# Patient Record
Sex: Female | Born: 1995
Health system: Southern US, Community
[De-identification: ages and names within clinical notes are randomized; demographics above are authoritative.]

## PROBLEM LIST (undated history)

## (undated) ENCOUNTER — Inpatient Hospital Stay (HOSPITAL_COMMUNITY): Payer: Self-pay

## (undated) DIAGNOSIS — I10 Essential (primary) hypertension: Secondary | ICD-10-CM

## (undated) DIAGNOSIS — O139 Gestational [pregnancy-induced] hypertension without significant proteinuria, unspecified trimester: Secondary | ICD-10-CM

## (undated) DIAGNOSIS — J302 Other seasonal allergic rhinitis: Secondary | ICD-10-CM

## (undated) DIAGNOSIS — N921 Excessive and frequent menstruation with irregular cycle: Secondary | ICD-10-CM

## (undated) HISTORY — PX: NO PAST SURGERIES: SHX2092

## (undated) HISTORY — PX: IUD REMOVAL: SHX5392

---
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
## 2015-01-18 ENCOUNTER — Ambulatory Visit (INDEPENDENT_AMBULATORY_CARE_PROVIDER_SITE_OTHER): Payer: Medicaid Other | Admitting: Women's Health

## 2015-01-18 ENCOUNTER — Encounter: Payer: Self-pay | Admitting: Women's Health

## 2015-01-18 DIAGNOSIS — Z3041 Encounter for surveillance of contraceptive pills: Secondary | ICD-10-CM

## 2015-01-18 NOTE — Progress Notes (Signed)
Patient ID: Jennifer LundAmber Aguilar Legore, female   DOB: 1995/11/10, 20 y.o.   MRN: [id number]   [facility]  Patient name: Jennifer Aguilar Auletta MRN [id number]  Date of birth: 1995/11/10  CC & HPI:  Jennifer Aguilar Swarthout is a 20 y.o. 502P2002 Caucasian female presenting today for f/u on Sprintec- doing well, bleeding was kind of irregular in beginning but is straightening up now. No complaints. Remembers to take pills daily.   No LMP recorded. Patient has had an implant. The current method of family planning is OCP (estrogen/progesterone). Last pap n/a <21yo  Pertinent History Reviewed:  Medical & Surgical Hx:   Past Medical History  Diagnosis Date  . Seasonal allergies    Past Surgical History  Procedure Laterality Date  . No past surgeries     Medications: Reviewed & Updated - see associated section Social History: Reviewed -  reports that she has never smoked. She has never used smokeless tobacco.  Objective Findings:  Vitals: There were no vitals taken for this visit. There is no weight on file to calculate BMI.  Physical Examination: General appearance - alert, well appearing, and in no distress  No results found for this or any previous visit (from the past 24 hour(s)).   Assessment & Plan:  A:   COC f/u  P:  Continue sprintec daily  Return for 9mths for f/u./refill  Marge DuncansBooker, Jahmarion Popoff Randall CNM, [facility] 01/18/2015 2:04 PM

## 2015-01-25 ENCOUNTER — Telehealth: Payer: Self-pay | Admitting: Family Medicine

## 2015-01-25 MED ORDER — ONDANSETRON 8 MG PO TBDP: 8.0000 mg | ORAL_TABLET | Freq: Three times a day (TID) | ORAL | Status: DC | PRN

## 2015-01-25 NOTE — Telephone Encounter (Signed)
This sounds like a viral process. Clear liquids, sips, avoid regular food for female. Once tolerating liquids then can start with bland diet. Zofran 8 mg ODT one 3 times a day when necessary nausea, OTC Imodium for diarrhea, if not dramatically better within 48 hours recheck

## 2015-01-25 NOTE — Telephone Encounter (Signed)
Called patient and informed her per Dr.Scott Luking-This sounds like a viral process. Clear liquids, sips, avoid regular food for female. Once tolerating liquids then can start with bland diet. Zofran 8 mg ODT one 3 times a day when necessary nausea, OTC Imodium for diarrhea, if not dramatically better within 48 hours recheck. Patient verbalized understanding. Zofran sent into requested pharmacy.

## 2015-01-25 NOTE — Telephone Encounter (Signed)
Called and discussed patient symptoms. Patient stated that onset of symptoms this morning. C/o vomiting, and diarrhea, hot flashes (unsure if fever due to not having a thermometer). Would like something called in. Please advise?

## 2015-01-25 NOTE — Telephone Encounter (Signed)
Patient woke up this morning with diarreah and throwing up can you call in something or does she need to be seen. [facility]

## 2015-03-27 ENCOUNTER — Encounter: Payer: Self-pay | Admitting: Women's Health

## 2015-03-27 ENCOUNTER — Ambulatory Visit (INDEPENDENT_AMBULATORY_CARE_PROVIDER_SITE_OTHER): Payer: Medicaid Other | Admitting: Women's Health

## 2015-03-27 VITALS — BP 120/64 | HR 76 | Wt 153.0 lb

## 2015-03-27 DIAGNOSIS — Z3202 Encounter for pregnancy test, result negative: Secondary | ICD-10-CM | POA: Diagnosis not present

## 2015-03-27 DIAGNOSIS — N921 Excessive and frequent menstruation with irregular cycle: Secondary | ICD-10-CM | POA: Diagnosis not present

## 2015-03-27 LAB — POCT WET PREP (WET MOUNT): CLUE CELLS WET PREP WHIFF POC: NEGATIVE

## 2015-03-27 LAB — POCT URINE PREGNANCY: Preg Test, Ur: NEGATIVE

## 2015-03-27 NOTE — Progress Notes (Signed)
Patient ID: Jennifer LundAmber D Aguilar, female   DOB: 1995-02-14, 20 y.o.   MRN: [id number]   [facility]  Patient name: Jennifer Aguilar MRN [id number]  Date of birth: 1995-02-14  CC & HPI:  Jennifer Aguilar is a 20 y.o. [id number] Caucasian female presenting today for report of irregular and occ heavy periods on sprintec. Taking daily, reports not missing any or taking late. Bled whole month of Jan, then 2 weeks in Feb, then stopped for a week or so and began back. Last bleeding just stopped ~3-4d ago. +cramping. Occ small clots. At heaviest may change pad q 1hr. Denies abnormal/malodorous d/c or itching/irritation. No change in sex partners.   Patient's last menstrual period was 03/13/2015. The current method of family planning is OCP (estrogen/progesterone). Last pap <21yo  Pertinent History Reviewed:  Medical & Surgical Hx:   Past medical, surgical, family, and social history reviewed in electronic medical record Medications: Reviewed & Updated - see associated section Allergies: Reviewed in electronic medical record  Objective Findings:  Vitals: BP 120/64 mmHg  Pulse 76  Wt 153 lb (69.4 kg)  LMP 03/13/2015  Breastfeeding? No Body mass index is 25.46 kg/(m^2).  Physical Examination: General appearance - alert, well appearing, and in no distress Pelvic - normal external genitalia, vulva, vagina, cervix, uterus and adnexa, no bleeding currently  Results for orders placed or performed in visit on 03/27/15 (from the past 24 hour(s))  POCT Wet Prep Mellody Drown([place]et [place])   Collection Time: 03/27/15  3:12 PM  Result Value Ref Range   Source Wet Prep POC vaginal    WBC, Wet Prep HPF POC few    Bacteria Wet Prep HPF POC None None, Few, Too numerous to count   BACTERIA WET PREP MORPHOLOGY POC     Clue Cells Wet Prep HPF POC None None, Too numerous to count   Clue Cells Wet Prep Whiff POC Negative Whiff    Yeast Wet Prep HPF POC None    KOH Wet Prep POC     Trichomonas Wet Prep HPF POC none      Urine preg: NEG Assessment & Plan:  A:   BTB on COCs  P:  GC/CT today, If neg will consider pelvic u/s as she has been having irregular bleeding since beginning coc's- had switched from LoLoestrin to sprintec b/c of same. May also do megace algorithm.   Return for I will call pt.  Marge DuncansBooker, Mariel Lukins Randall CNM, [facility] 03/27/2015 3:12 PM

## 2015-03-28 LAB — GC/CHLAMYDIA PROBE AMP
Chlamydia trachomatis, NAA: NEGATIVE
Neisseria gonorrhoeae by PCR: NEGATIVE

## 2015-04-11 ENCOUNTER — Telehealth: Payer: Self-pay | Admitting: Women's Health

## 2015-04-11 NOTE — Telephone Encounter (Signed)
Pt scheduled for pelvic ultrasound and to see Joellyn HaffKim Booker, CNM on 04/17/2015. Pt states she should not be on her period at that time.

## 2015-04-11 NOTE — Telephone Encounter (Signed)
Pt informed of negative GC/CHL. Pt states what is the plain from here?

## 2015-04-12 ENCOUNTER — Other Ambulatory Visit: Payer: Self-pay | Admitting: Women's Health

## 2015-04-12 DIAGNOSIS — N921 Excessive and frequent menstruation with irregular cycle: Secondary | ICD-10-CM

## 2015-04-17 ENCOUNTER — Encounter: Payer: Self-pay | Admitting: Women's Health

## 2015-04-17 ENCOUNTER — Encounter: Payer: Self-pay | Admitting: *Deleted

## 2015-04-17 ENCOUNTER — Ambulatory Visit (INDEPENDENT_AMBULATORY_CARE_PROVIDER_SITE_OTHER): Payer: Medicaid Other | Admitting: Women's Health

## 2015-04-17 ENCOUNTER — Other Ambulatory Visit: Payer: Self-pay | Admitting: Women's Health

## 2015-04-17 ENCOUNTER — Ambulatory Visit (INDEPENDENT_AMBULATORY_CARE_PROVIDER_SITE_OTHER): Payer: Medicaid Other | Admitting: *Deleted

## 2015-04-17 ENCOUNTER — Ambulatory Visit (INDEPENDENT_AMBULATORY_CARE_PROVIDER_SITE_OTHER): Payer: Medicaid Other

## 2015-04-17 VITALS — BP 130/70 | HR 80 | Wt 152.0 lb

## 2015-04-17 DIAGNOSIS — Z3042 Encounter for surveillance of injectable contraceptive: Secondary | ICD-10-CM

## 2015-04-17 DIAGNOSIS — Z3202 Encounter for pregnancy test, result negative: Secondary | ICD-10-CM | POA: Diagnosis not present

## 2015-04-17 DIAGNOSIS — N921 Excessive and frequent menstruation with irregular cycle: Secondary | ICD-10-CM

## 2015-04-17 LAB — POCT URINE PREGNANCY: PREG TEST UR: NEGATIVE

## 2015-04-17 MED ORDER — MEDROXYPROGESTERONE ACETATE 150 MG/ML IM SUSP
150.0000 mg | Freq: Once | INTRAMUSCULAR | Status: AC
  Administered 2015-04-17: 150 mg via INTRAMUSCULAR

## 2015-04-17 MED ORDER — MEDROXYPROGESTERONE ACETATE 150 MG/ML IM SUSP: 150.0000 mg | INTRAMUSCULAR | Status: DC

## 2015-04-17 NOTE — Progress Notes (Signed)
Pelvic/TV US today for AUB. Anteverted uterus measuring 7.3 x 3.0 x 5.0 cm. Endometrium is thin and symmetrical, measuring 1.4 mm with trace fluid within.  Bilateral ovaries appear normal and mobile. Trace free fluid in posterior CDS.

## 2015-04-17 NOTE — Progress Notes (Signed)
Patient ID: Jennifer LundAmber D Aguilar, female   DOB: 04-05-1995, 20 y.o.   MRN: [id number]   [facility]  Patient name: Jennifer Aguilar MRN [id number]  Date of birth: 04-05-1995  CC & HPI:  Jennifer Aguilar is a 20 y.o. [id number] Caucasian female presenting today for f/u after pelvic u/s for irregular and heavy bleeding on sprintec. Had same problem w/ nexplanon, that's why she wanted it removed. Was started on Lo Loestrin then increased to sprintec with no improvement in bleeding. Not missing pills or taking any late. Will bleed for a week, stop for a week, start back and may bleed for an entire month. Reports at heaviest times changes pad 3-4 times in an hour. +cramping. Occ small clots. Has had TSH, GC/CT, wet prep, Hgb all of which were normal.  Periods prior to pregnancy were regular and of normal flow.  Patient's last menstrual period was 04/12/2015. The current method of family planning is sprintec. Last pap <21yo  Pertinent History Reviewed:  Medical & Surgical Hx:   Past medical, surgical, family, and social history reviewed in electronic medical record Medications: Reviewed & Updated - see associated section Allergies: Reviewed in electronic medical record  Objective Findings:  Vitals: BP 130/70 mmHg  Pulse 80  Wt 152 lb (68.947 kg)  LMP 04/12/2015  Breastfeeding? No Body mass index is 25.29 kg/(m^2).  Physical Examination: General appearance - alert, well appearing, and in no distress  Today's pelvic u/s:  Jennifer Aguilar is a 20 y.o. [id number] LMP 04-12-15 for a pelvic sonogram for AUB, menorrhagia with irregular cycle.  Uterus 7.3 x 3.0 x 5.0 cm, anteverted.  Endometrium 1.4 mm, symmetrical, trace fluid within  Right ovary 3.6 x 2.3 x 2.7 cm, mobile  Left ovary 2.5 x 2.4 x 2.3 cm, mobile.  Trace free fluid in posterior CDS.  Technologist Comments:  Pelvic/TV US today for AUB. Anteverted uterus measuring 7.3 x 3.0 x  5.0 cm. Endometrium is thin and symmetrical, measuring 1.4 mm with trace fluid within. Bilateral ovaries appear normal and mobile. Trace free fluid in posterior CDS.  Iva LentoStalter, Carrie Manuel 04/17/2015 1:53 PM       Assessment & Plan:  A:   Heavy irregular bleeding on coc's  Normal pelvic u/s  P:  Discussed option of switching to different coc vs depo or IUD- prefers trying depo  Rx depo w/ 3RF  To give until after 2nd shot to notice difference, if still having problems let us know  Condoms x 2wks  Return for this pm for depo.  Marge DuncansBooker, Revonda Menter Randall CNM, [facility] 04/17/2015 2:09 PM

## 2015-04-17 NOTE — Progress Notes (Signed)
Pt here for Depo. Pt tolerated shot well. Return in 12 weeks for next shot. JSY 

## 2015-04-17 NOTE — Patient Instructions (Signed)
Use condoms for 2 weeks Can take until after 2nd shot (in 3 months) to start helping bleeding, let us know if you are still having problems after that  Medroxyprogesterone injection [Contraceptive] What is this medicine? MEDROXYPROGESTERONE (me DROX ee proe JES te rone) contraceptive injections prevent pregnancy. They provide effective birth control for 3 months. Depo-subQ Provera 104 is also used for treating pain related to endometriosis. This medicine may be used for other purposes; ask your health care provider or pharmacist if you have questions. What should I tell my health care provider before I take this medicine? They need to know if you have any of these conditions: -frequently drink alcohol -asthma -blood vessel disease or a history of a blood clot in the lungs or legs -bone disease such as osteoporosis -breast cancer -diabetes -eating disorder (anorexia nervosa or bulimia) -high blood pressure -HIV infection or AIDS -kidney disease -liver disease -mental depression -migraine -seizures (convulsions) -stroke -tobacco smoker -vaginal bleeding -an unusual or allergic reaction to medroxyprogesterone, other hormones, medicines, foods, dyes, or preservatives -pregnant or trying to get pregnant -breast-feeding How should I use this medicine? Depo-Provera Contraceptive injection is given into a muscle. Depo-subQ Provera 104 injection is given under the skin. These injections are given by a health care professional. You must not be pregnant before getting an injection. The injection is usually given during the first 5 days after the start of a menstrual period or 6 weeks after delivery of a baby. Talk to your pediatrician regarding the use of this medicine in children. Special care may be needed. These injections have been used in female children who have started having menstrual periods. Overdosage: If you think you have taken too much of this medicine contact a poison control  center or emergency room at once. NOTE: This medicine is only for you. Do not share this medicine with others. What if I miss a dose? Try not to miss a dose. You must get an injection once every 3 months to maintain birth control. If you cannot keep an appointment, call and reschedule it. If you wait longer than 13 weeks between Depo-Provera contraceptive injections or longer than 14 weeks between Depo-subQ Provera 104 injections, you could get pregnant. Use another method for birth control if you miss your appointment. You may also need a pregnancy test before receiving another injection. What may interact with this medicine? Do not take this medicine with any of the following medications: -bosentan This medicine may also interact with the following medications: -aminoglutethimide -antibiotics or medicines for infections, especially rifampin, rifabutin, rifapentine, and griseofulvin -aprepitant -barbiturate medicines such as phenobarbital or primidone -bexarotene -carbamazepine -medicines for seizures like ethotoin, felbamate, oxcarbazepine, phenytoin, topiramate -modafinil -St. John's wort This list may not describe all possible interactions. Give your health care provider a list of all the medicines, herbs, non-prescription drugs, or dietary supplements you use. Also tell them if you smoke, drink alcohol, or use illegal drugs. Some items may interact with your medicine. What should I watch for while using this medicine? This drug does not protect you against HIV infection (AIDS) or other sexually transmitted diseases. Use of this product may cause you to lose calcium from your bones. Loss of calcium may cause weak bones (osteoporosis). Only use this product for more than 2 years if other forms of birth control are not right for you. The longer you use this product for birth control the more likely you will be at risk for weak bones. Ask your health care  professional how you can keep strong  bones. You may have a change in bleeding pattern or irregular periods. Many females stop having periods while taking this drug. If you have received your injections on time, your chance of being pregnant is very low. If you think you may be pregnant, see your health care professional as soon as possible. Tell your health care professional if you want to get pregnant within the next year. The effect of this medicine may last a long time after you get your last injection. What side effects may I notice from receiving this medicine? Side effects that you should report to your doctor or health care professional as soon as possible: -allergic reactions like skin rash, itching or hives, swelling of the face, lips, or tongue -breast tenderness or discharge -breathing problems -changes in vision -depression -feeling faint or lightheaded, falls -fever -pain in the abdomen, chest, groin, or leg -problems with balance, talking, walking -unusually weak or tired -yellowing of the eyes or skin Side effects that usually do not require medical attention (report to your doctor or health care professional if they continue or are bothersome): -acne -fluid retention and swelling -headache -irregular periods, spotting, or absent periods -temporary pain, itching, or skin reaction at site where injected -weight gain This list may not describe all possible side effects. Call your doctor for medical advice about side effects. You may report side effects to FDA at 1-800-FDA-1088. Where should I keep my medicine? This does not apply. The injection will be given to you by a health care professional. NOTE: This sheet is a summary. It may not cover all possible information. If you have questions about this medicine, talk to your doctor, pharmacist, or health care provider.    2016, Elsevier/Gold Standard. (2008-01-22 18:37:56)

## 2015-07-10 ENCOUNTER — Ambulatory Visit: Payer: Medicaid Other

## 2016-01-15 NOTE — L&D Delivery Note (Addendum)
Delivery Note Progressed quickly to complete dilation with urge to push.    At 8:21 AM a viable and healthy female was delivered via  (Presentation: OA  ).  APGAR: 9,9, weight  .   Placenta status: spontaneous and grossly intact with 3 vessel Cord:  with the following complications: Loose nuchal x 1  Anesthesia:  none Episiotomy:  none Lacerations:  Small labial,not bleeding not repaired Suture Repair: none Est. Blood Loss (mL):  100  Mom to postpartum.  Baby to Couplet care / Skin to Skin.  Wynelle Bourgeois 09/07/2016, 8:34 AM

## 2016-04-08 ENCOUNTER — Ambulatory Visit: Payer: Medicaid Other | Admitting: Adult Health

## 2016-04-18 ENCOUNTER — Ambulatory Visit: Payer: Self-pay | Admitting: Adult Health

## 2016-04-24 ENCOUNTER — Encounter: Payer: Self-pay | Admitting: Adult Health

## 2016-04-24 ENCOUNTER — Ambulatory Visit (INDEPENDENT_AMBULATORY_CARE_PROVIDER_SITE_OTHER): Payer: Medicaid Other | Admitting: Adult Health

## 2016-04-24 VITALS — BP 118/70 | HR 80 | Ht 63.0 in | Wt 156.0 lb

## 2016-04-24 DIAGNOSIS — Z363 Encounter for antenatal screening for malformations: Secondary | ICD-10-CM

## 2016-04-24 DIAGNOSIS — N912 Amenorrhea, unspecified: Secondary | ICD-10-CM

## 2016-04-24 DIAGNOSIS — Z3201 Encounter for pregnancy test, result positive: Secondary | ICD-10-CM | POA: Diagnosis not present

## 2016-04-24 DIAGNOSIS — Z3490 Encounter for supervision of normal pregnancy, unspecified, unspecified trimester: Secondary | ICD-10-CM

## 2016-04-24 LAB — POCT URINE PREGNANCY: Preg Test, Ur: POSITIVE — AB

## 2016-04-24 MED ORDER — PRENATAL PLUS/IRON 27-1 MG PO TABS: ORAL_TABLET | ORAL | 12 refills | Status: DC

## 2016-04-24 NOTE — Patient Instructions (Signed)

## 2016-04-24 NOTE — Progress Notes (Signed)
Subjective:     Patient ID: Jennifer Aguilar, female   DOB: 03-27-1995, 21 y.o.   MRN: [id number]  HPI [name] is a 21 year old white female in for UPT, has missed several periods and had +HPT, has +FM. Last depo in April 2017, and periods were irregular after that.   Review of Systems +missed periods +FM Irregular periods  Reviewed past medical,surgical, social and family history. Reviewed medications and allergies.     Objective:   Physical Exam BP 118/70 (BP Location: Left Arm, Patient Position: Sitting, Cuff Size: Small)   Pulse 80   Ht  (1.6 m)   Wt 156 lb (70.8 kg)   LMP 11/28/2015 (Approximate)   BMI 27.63 kg/m UPT +, about 21+1 by LMP, with EDD 09/04/16, Skin warm and dry. Neck: mid line trachea, normal thyroid, good ROM, no lymphadenopathy noted. Lungs: clear to ausculation bilaterally. Cardiovascular: regular rate and rhythm.Abdomen is soft and non tender, FH about U-2, FHR 154.    Assessment:     1. Pregnancy test positive   2. Pregnancy, unspecified gestational age   39. Antenatal screening for malformation using ultrasonics       Plan:     Meds ordered this encounter  Medications  . Prenatal Vit-Fe Fumarate-FA (PRENATAL PLUS/IRON) 27-1 MG TABS    Sig: Take 1 daily    Dispense:  30 each    Refill:  12    Order Specific Question:   Supervising Provider    Answer:   Lazaro Arms [[id number]]  Return in 2 days for anatomy US Review handout on second trimester

## 2016-04-26 ENCOUNTER — Ambulatory Visit (INDEPENDENT_AMBULATORY_CARE_PROVIDER_SITE_OTHER): Payer: Medicaid Other

## 2016-04-26 DIAGNOSIS — Z363 Encounter for antenatal screening for malformations: Secondary | ICD-10-CM | POA: Diagnosis not present

## 2016-04-26 NOTE — Progress Notes (Signed)
[place] 21+3 wks,breech,svp of fluid 5 cm,cx 4.4 cm,normal ovaries bilat,fhr 141 bpm,ant pl gr 0,EFW 407 g,anatomy complete,no obvious abnormalities seen

## 2016-05-09 ENCOUNTER — Other Ambulatory Visit (HOSPITAL_COMMUNITY)
Admission: RE | Admit: 2016-05-09 | Discharge: 2016-05-09 | Disposition: A | Discharge status: Home / Self Care | Payer: Medicaid Other | Source: Ambulatory Visit | Attending: Obstetrics & Gynecology | Admitting: Obstetrics & Gynecology

## 2016-05-09 ENCOUNTER — Ambulatory Visit (INDEPENDENT_AMBULATORY_CARE_PROVIDER_SITE_OTHER): Payer: Medicaid Other | Admitting: Women's Health

## 2016-05-09 ENCOUNTER — Encounter: Payer: Self-pay | Admitting: Women's Health

## 2016-05-09 ENCOUNTER — Ambulatory Visit: Payer: Medicaid Other | Admitting: *Deleted

## 2016-05-09 VITALS — BP 120/62 | HR 92 | Wt 160.0 lb

## 2016-05-09 DIAGNOSIS — Z3482 Encounter for supervision of other normal pregnancy, second trimester: Secondary | ICD-10-CM | POA: Diagnosis not present

## 2016-05-09 DIAGNOSIS — O0930 Supervision of pregnancy with insufficient antenatal care, unspecified trimester: Secondary | ICD-10-CM

## 2016-05-09 DIAGNOSIS — Z3A23 23 weeks gestation of pregnancy: Secondary | ICD-10-CM

## 2016-05-09 DIAGNOSIS — Z3490 Encounter for supervision of normal pregnancy, unspecified, unspecified trimester: Secondary | ICD-10-CM | POA: Insufficient documentation

## 2016-05-09 DIAGNOSIS — O0932 Supervision of pregnancy with insufficient antenatal care, second trimester: Secondary | ICD-10-CM

## 2016-05-09 DIAGNOSIS — Z1389 Encounter for screening for other disorder: Secondary | ICD-10-CM

## 2016-05-09 DIAGNOSIS — Z331 Pregnant state, incidental: Secondary | ICD-10-CM

## 2016-05-09 DIAGNOSIS — O09299 Supervision of pregnancy with other poor reproductive or obstetric history, unspecified trimester: Secondary | ICD-10-CM

## 2016-05-09 DIAGNOSIS — Z124 Encounter for screening for malignant neoplasm of cervix: Secondary | ICD-10-CM

## 2016-05-09 DIAGNOSIS — O09292 Supervision of pregnancy with other poor reproductive or obstetric history, second trimester: Secondary | ICD-10-CM

## 2016-05-09 LAB — POCT URINALYSIS DIPSTICK
GLUCOSE UA: NEGATIVE
Ketones, UA: NEGATIVE
Leukocytes, UA: NEGATIVE
Nitrite, UA: NEGATIVE
Protein, UA: NEGATIVE
RBC UA: NEGATIVE

## 2016-05-09 NOTE — Progress Notes (Signed)
Subjective:  Jennifer Aguilar is a 21 y.o. G34P2002 Caucasian female at [redacted]w[redacted]d by LMP c/w 21wk u/s, being seen today for her first obstetrical visit.  Her obstetrical history is significant for term SVB x 2, pre-e w/ 1st.  Late to care @ 23wks. States she's had 2 babies before, and she knows how to take care of herself during early pregnancy, and lives in [place]. Divorced from previous husband, has been w/ new partner x 8yr. Pregnancy history fully reviewed.  Patient reports no complaints. Denies vb, cramping, uti s/s, abnormal/malodorous vag d/c, or vulvovaginal itching/irritation.  BP 120/62   Pulse 92   Wt 160 lb (72.6 kg)   LMP 11/28/2015 (Approximate)   BMI 28.34 kg/m   HISTORY: OB History  Gravida Para Term Preterm AB Living  SAB TAB Ectopic Multiple Live Births          2    # Outcome Date GA Lbr Len/2nd Weight Sex Delivery Anes PTL Lv  3 Current           2 Term 06/24/14 [redacted]w[redacted]d 00:38 / 00:02 6 lb 6.5 oz (2.905 kg) M Vag-Spont None N LIV  1 Term 05/15/13 [redacted]w[redacted]d / 00:08 5 lb 15.6 oz (2.71 kg) F Vag-Spont None N LIV     Complications: Gestational hypertension     Past Medical History:  Diagnosis Date  . Seasonal allergies    Past Surgical History:  Procedure Laterality Date  . NO PAST SURGERIES     Family History  Problem Relation Age of Onset  . Diabetes Mother   . Hypertension Mother   . Hypertension Father   . Heart failure Maternal Grandfather   . Hypertension Sister   . Hypertension Paternal Grandfather   . Heart failure Maternal Grandmother   . Diabetes Maternal Grandmother   . Cancer Paternal Aunt   . Hypertension Paternal Uncle     Exam   System:     General: Well developed & nourished, no acute distress   Skin: Warm & dry, normal coloration and turgor, no rashes   Neurologic: Alert & oriented, normal mood   Cardiovascular: Regular rate & rhythm   Respiratory: Effort & rate normal, LCTAB, acyanotic   Abdomen: Soft, non tender   Extremities:  normal strength, tone   Pelvic Exam:    Perineum: Normal perineum   Vulva: Normal, no lesions   Vagina:  Normal mucosa, normal discharge   Cervix: ?multiple small nabothians, bulbous, appears closed   Uterus: Normal size/shape/contour for GA   Thin prep pap smear obtained w/ reflex high risk HPV cotesting FHR: 147 via doppler FH: 24cm   Assessment:   Pregnancy: [id number] Patient Active Problem List   Diagnosis Date Noted  . History of chlamydia 11/07/2012    Priority: High  . Supervision of normal pregnancy 05/09/2016  . Antenatal screening for malformation using ultrasonics 04/24/2016  . Menometrorrhagia 08/29/2014  . 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 PATELLO-FEMORAL SYNDROME 06/01/2008    [redacted]w[redacted]d G3P2002 New OB visit Late to care h/o pre-e first pregnancy  Plan:  Initial labs obtained Continue prenatal vitamins Problem list reviewed and updated Reviewed n/v relief measures and warning s/s to report Reviewed recommended weight gain based on pre-gravid BMI Encouraged well-balanced diet Genetic Screening discussed Quad Screen: too late Cystic fibrosis screening discussed declined Ultrasound discussed; fetal survey: results reviewed Follow up in 4 weeks for visit and pn2 CCNC completed Too late for baby ASA to  be of benefit  Marge Duncans CNM, [facility] 05/09/2016 3:10 PM

## 2016-05-09 NOTE — Patient Instructions (Signed)
You will have your sugar test next visit.  Please do not eat or drink anything after midnight the night before you come, not even water.  You will be here for at least two hours.     Call the office ([phone]) or go to [facility] if:  You begin to have strong, frequent contractions  Your water breaks.  Sometimes it is a big gush of fluid, sometimes it is just a trickle that keeps getting your panties wet or running down your legs  You have vaginal bleeding.  It is normal to have a small amount of spotting if your cervix was checked.   You don't feel your baby moving like normal.  If you don't, get you something to eat and drink and lay down and focus on feeling your baby move.   If your baby is still not moving like normal, you should call the office or go to [facility].  Second Trimester of Pregnancy The second trimester is from week 13 through week 28, months 4 through 6. The second trimester is often a time when you feel your best. Your body has also adjusted to being pregnant, and you begin to feel better physically. Usually, morning sickness has lessened or quit completely, you may have more energy, and you may have an increase in appetite. The second trimester is also a time when the fetus is growing rapidly. At the end of the sixth month, the fetus is about 9 inches long and weighs about 1 pounds. You will likely begin to feel the baby move (quickening) between 18 and 20 weeks of the pregnancy. BODY CHANGES Your body goes through many changes during pregnancy. The changes vary from woman to woman.   Your weight will continue to increase. You will notice your lower abdomen bulging out.  You may begin to get stretch marks on your hips, abdomen, and breasts.  You may develop headaches that can be relieved by medicines approved by your health care provider.  You may urinate more often because the fetus is pressing on your bladder.  You may develop or continue to have  heartburn as a result of your pregnancy.  You may develop constipation because certain hormones are causing the muscles that push waste through your intestines to slow down.  You may develop hemorrhoids or swollen, bulging veins (varicose veins).  You may have back pain because of the weight gain and pregnancy hormones relaxing your joints between the bones in your pelvis and as a result of a shift in weight and the muscles that support your balance.  Your breasts will continue to grow and be tender.  Your gums may bleed and may be sensitive to brushing and flossing.  Dark spots or blotches (chloasma, mask of pregnancy) may develop on your face. This will likely fade after the baby is born.  A dark line from your belly button to the pubic area (linea nigra) may appear. This will likely fade after the baby is born.  You may have changes in your hair. These can include thickening of your hair, rapid growth, and changes in texture. Some women also have hair loss during or after pregnancy, or hair that feels dry or thin. Your hair will most likely return to normal after your baby is born. WHAT TO EXPECT AT YOUR PRENATAL VISITS During a routine prenatal visit:  You will be weighed to make sure you and the fetus are growing normally.  Your blood pressure will be taken.    Your abdomen will be measured to track your baby's growth.  The fetal heartbeat will be listened to.  Any test results from the previous visit will be discussed. Your health care provider may ask you:  How you are feeling.  If you are feeling the baby move.  If you have had any abnormal symptoms, such as leaking fluid, bleeding, severe headaches, or abdominal cramping.  If you have any questions. Other tests that may be performed during your second trimester include:  Blood tests that check for:  Low iron levels (anemia).  Gestational diabetes (between 24 and 28 weeks).  Rh antibodies.  Urine tests to check  for infections, diabetes, or protein in the urine.  An ultrasound to confirm the proper growth and development of the baby.  An amniocentesis to check for possible genetic problems.  Fetal screens for spina bifida and Down syndrome. HOME CARE INSTRUCTIONS   Avoid all smoking, herbs, alcohol, and unprescribed drugs. These chemicals affect the formation and growth of the baby.  Follow your health care provider's instructions regarding medicine use. There are medicines that are either safe or unsafe to take during pregnancy.  Exercise only as directed by your health care provider. Experiencing uterine cramps is a good sign to stop exercising.  Continue to eat regular, healthy meals.  Wear a good support bra for breast tenderness.  Do not use hot tubs, steam rooms, or saunas.  Wear your seat belt at all times when driving.  Avoid raw meat, uncooked cheese, cat litter boxes, and soil used by cats. These carry germs that can cause birth defects in the baby.  Take your prenatal vitamins.  Try taking a stool softener (if your health care provider approves) if you develop constipation. Eat more high-fiber foods, such as fresh vegetables or fruit and whole grains. Drink plenty of fluids to keep your urine clear or pale yellow.  Take warm sitz baths to soothe any pain or discomfort caused by hemorrhoids. Use hemorrhoid cream if your health care provider approves.  If you develop varicose veins, wear support hose. Elevate your feet for 15 minutes, 3-4 times a day. Limit salt in your diet.  Avoid heavy lifting, wear low heel shoes, and practice good posture.  Rest with your legs elevated if you have leg cramps or low back pain.  Visit your dentist if you have not gone yet during your pregnancy. Use a soft toothbrush to brush your teeth and be gentle when you floss.  A sexual relationship may be continued unless your health care provider directs you otherwise.  Continue to go to all your  prenatal visits as directed by your health care provider. SEEK MEDICAL CARE IF:   You have dizziness.  You have mild pelvic cramps, pelvic pressure, or nagging pain in the abdominal area.  You have persistent nausea, vomiting, or diarrhea.  You have a bad smelling vaginal discharge.  You have pain with urination. SEEK IMMEDIATE MEDICAL CARE IF:   You have a fever.  You are leaking fluid from your vagina.  You have spotting or bleeding from your vagina.  You have severe abdominal cramping or pain.  You have rapid weight gain or loss.  You have shortness of breath with chest pain.  You notice sudden or extreme swelling of your face, hands, ankles, feet, or legs.  You have not felt your baby move in over an hour.  You have severe headaches that do not go away with medicine.  You have vision changes.   Document Released: 12/25/2000 Document Revised: 01/05/2013 Document Reviewed: 03/03/2012 Exit[employer] Patient Information 2015 [employer]. This information is not intended to replace advice given to you by your health care provider. Make sure you discuss any questions you have with your health care provider.     

## 2016-05-10 ENCOUNTER — Encounter: Payer: Self-pay | Admitting: Women's Health

## 2016-05-10 LAB — CBC
Hematocrit: 40.1 % (ref 34.0–46.6)
Hemoglobin: 13.3 g/dL (ref 11.1–15.9)
MCH: 30.3 pg (ref 26.6–33.0)
MCHC: 33.2 g/dL (ref 31.5–35.7)
MCV: 91 fL (ref 79–97)
PLATELETS: 285 10*3/uL (ref 150–379)
RBC: 4.39 x10E6/uL (ref 3.77–5.28)
RDW: 13.7 % (ref 12.3–15.4)
WBC: 10.1 10*3/uL (ref 3.4–10.8)

## 2016-05-10 LAB — PMP SCREEN PROFILE (10S), URINE
AMPHETAMINE SCRN UR: NEGATIVE ng/mL
BARBITURATE SCRN UR: NEGATIVE ng/mL
Benzodiazepine Screen, Urine: NEGATIVE ng/mL
CANNABINOIDS UR QL SCN: NEGATIVE ng/mL
COCAINE(METAB.) SCREEN, URINE: NEGATIVE ng/mL
Creatinine(Crt), U: 146.5 mg/dL (ref 20.0–300.0)
Methadone Scn, Ur: NEGATIVE ng/mL
Opiate Scrn, Ur: NEGATIVE ng/mL
Oxycodone+Oxymorphone Ur Ql Scn: NEGATIVE ng/mL
PCP SCRN UR: NEGATIVE ng/mL
Ph of Urine: 5.5 (ref 4.5–8.9)
Propoxyphene, Screen: NEGATIVE ng/mL

## 2016-05-10 LAB — URINALYSIS, ROUTINE W REFLEX MICROSCOPIC
Bilirubin, UA: NEGATIVE
Glucose, UA: NEGATIVE
KETONES UA: NEGATIVE
Leukocytes, UA: NEGATIVE
NITRITE UA: NEGATIVE
Protein, UA: NEGATIVE
RBC, UA: NEGATIVE
Specific Gravity, UA: 1.025 (ref 1.005–1.030)
UUROB: 1 mg/dL (ref 0.2–1.0)
pH, UA: 5.5 (ref 5.0–7.5)

## 2016-05-10 LAB — ANTIBODY SCREEN: Antibody Screen: NEGATIVE

## 2016-05-10 LAB — VARICELLA ZOSTER ANTIBODY, IGG: Varicella zoster IgG: 265 index (ref >165)

## 2016-05-10 LAB — HEPATITIS B SURFACE ANTIGEN: Hepatitis B Surface Ag: NEGATIVE

## 2016-05-10 LAB — RPR: RPR Ser Ql: NONREACTIVE

## 2016-05-10 LAB — HIV ANTIBODY (ROUTINE TESTING W REFLEX): HIV Screen 4th Generation wRfx: NONREACTIVE

## 2016-05-10 LAB — ABO/RH: Rh Factor: NEGATIVE

## 2016-05-10 LAB — RUBELLA SCREEN: RUBELLA: 2.51 {index} (ref >0.99)

## 2016-05-11 LAB — URINE CULTURE: Organism ID, Bacteria: NO GROWTH

## 2016-05-15 ENCOUNTER — Encounter: Payer: Self-pay | Admitting: Women's Health

## 2016-05-15 ENCOUNTER — Telehealth: Payer: Self-pay | Admitting: *Deleted

## 2016-05-15 DIAGNOSIS — R87619 Unspecified abnormal cytological findings in specimens from cervix uteri: Secondary | ICD-10-CM | POA: Insufficient documentation

## 2016-05-15 LAB — CYTOLOGY - PAP
CHLAMYDIA, DNA PROBE: NEGATIVE
DIAGNOSIS: UNDETERMINED — AB
HPV: NOT DETECTED
Neisseria Gonorrhea: NEGATIVE

## 2016-05-15 NOTE — Telephone Encounter (Signed)
Informed patient of abnormal pap but HRHPV negative and need for repeat pap in 1 year. Pt verbalized understanding.

## 2016-06-05 ENCOUNTER — Encounter: Payer: Self-pay | Admitting: Obstetrics and Gynecology

## 2016-06-05 ENCOUNTER — Ambulatory Visit (INDEPENDENT_AMBULATORY_CARE_PROVIDER_SITE_OTHER): Payer: Medicaid Other | Admitting: Obstetrics and Gynecology

## 2016-06-05 ENCOUNTER — Other Ambulatory Visit: Payer: Medicaid Other

## 2016-06-05 VITALS — BP 118/80 | HR 97 | Wt 169.8 lb

## 2016-06-05 DIAGNOSIS — Z331 Pregnant state, incidental: Secondary | ICD-10-CM

## 2016-06-05 DIAGNOSIS — Z1389 Encounter for screening for other disorder: Secondary | ICD-10-CM

## 2016-06-05 DIAGNOSIS — Z3482 Encounter for supervision of other normal pregnancy, second trimester: Secondary | ICD-10-CM

## 2016-06-05 DIAGNOSIS — Z131 Encounter for screening for diabetes mellitus: Secondary | ICD-10-CM

## 2016-06-05 LAB — POCT URINALYSIS DIPSTICK
Blood, UA: NEGATIVE
Glucose, UA: NEGATIVE
Ketones, UA: NEGATIVE
NITRITE UA: NEGATIVE
PROTEIN UA: NEGATIVE

## 2016-06-05 NOTE — Progress Notes (Signed)
Patient ID: Jennifer Aguilar D Aguilar, female   DOB: [birth year], 21 y.o.   MRN: [id number]  [id number]  [facility]: [facility]  Chief Complaint  Patient presents with   Routine Prenatal Visit  ____  Patient complaints: none. She plans on breastfeeding. She is considering depo provera, but states she bled a lot on this method, or tubal ligation after delivery.   Discussion: 1.Discussed with pt risks and benefits of permanent sterilization at a young age. Encouraged pt to have lengthy discussion about the procedure with partner prior to decision making.   2. Discussed risks/benefits rationale of LARCs, including IUD and Nexplanon. She is more interested in IUD at this time.   At end of discussion, pt had opportunity to ask questions and has no further questions at this time.   Specific discussion of LARCs and permanent sterilization as noted above. Greater than 50% was spent in counseling and coordination of care with the patient.   Total time greater than: 15 minutes.      Patient reports good fetal movement. She denies any bleeding, rupture of membranes,or regular contractions.  Blood pressure 118/80, pulse 97, weight 169 lb 12.8 oz (77 kg), last menstrual period 11/28/2015, not currently breastfeeding.   Urine results:notable for +1 leukocytes  refer to the ob flow sheet for FH and FHR, ,                          Physical Examination: General appearance - alert, well appearing, and in no distress                                      Abdomen - FH 26 cm                                                        -FHR 147 bpm                                                         soft, nontender, nondistended, no masses or organomegaly                                            Questions were answered. Assessment: [name] G3P2002 @ 1446w1d  Pt amenable to IUD after delivery   Plan:  Continued routine obstetrical care  F/u in 4 weeks for routine prenatal care   By signing my name  below, I, Doreatha MartinEva Mathews, attest that this documentation has been prepared under the direction and in the presence of Tilda BurrowFerguson, John V, MD. Electronically Signed: Doreatha MartinEva Mathews, ED Scribe. 06/05/16. 10:46 AM.  I personally performed the services described in this documentation, which was SCRIBED in my presence. The recorded information has been reviewed and considered accurate. It has been edited as necessary during review. Tilda BurrowFERGUSON,JOHN V, MD

## 2016-06-06 LAB — GLUCOSE TOLERANCE, 2 HOURS W/ 1HR
Glucose, 1 hour: 84 mg/dL (ref 65–179)
Glucose, 2 hour: 78 mg/dL (ref 65–152)
Glucose, Fasting: 71 mg/dL (ref 65–91)

## 2016-07-03 ENCOUNTER — Encounter: Payer: Medicaid Other | Admitting: Advanced Practice Midwife

## 2016-07-04 ENCOUNTER — Encounter: Payer: Medicaid Other | Admitting: Obstetrics & Gynecology

## 2016-07-10 ENCOUNTER — Ambulatory Visit (INDEPENDENT_AMBULATORY_CARE_PROVIDER_SITE_OTHER): Payer: Medicaid Other | Admitting: Women's Health

## 2016-07-10 ENCOUNTER — Encounter: Payer: Self-pay | Admitting: Women's Health

## 2016-07-10 VITALS — BP 120/84 | HR 98 | Wt 178.0 lb

## 2016-07-10 DIAGNOSIS — Z1389 Encounter for screening for other disorder: Secondary | ICD-10-CM

## 2016-07-10 DIAGNOSIS — O09899 Supervision of other high risk pregnancies, unspecified trimester: Secondary | ICD-10-CM | POA: Diagnosis not present

## 2016-07-10 DIAGNOSIS — Z3483 Encounter for supervision of other normal pregnancy, third trimester: Secondary | ICD-10-CM

## 2016-07-10 DIAGNOSIS — O26899 Other specified pregnancy related conditions, unspecified trimester: Secondary | ICD-10-CM

## 2016-07-10 DIAGNOSIS — Z331 Pregnant state, incidental: Secondary | ICD-10-CM

## 2016-07-10 DIAGNOSIS — Z6791 Unspecified blood type, Rh negative: Secondary | ICD-10-CM | POA: Diagnosis not present

## 2016-07-10 LAB — POCT URINALYSIS DIPSTICK
Glucose, UA: NEGATIVE
Ketones, UA: NEGATIVE
LEUKOCYTES UA: NEGATIVE
NITRITE UA: NEGATIVE
Protein, UA: NEGATIVE
RBC UA: NEGATIVE

## 2016-07-10 MED ORDER — RHO D IMMUNE GLOBULIN 1500 UNIT/2ML IJ SOSY
300.0000 ug | PREFILLED_SYRINGE | Freq: Once | INTRAMUSCULAR | Status: AC
  Administered 2016-07-10: 300 ug via INTRAMUSCULAR

## 2016-07-10 NOTE — Progress Notes (Signed)
Pt given Rhogam Right VG without complications.  

## 2016-07-10 NOTE — Progress Notes (Signed)
Low-risk OB appointment [id number] [id number] Estimated Date of Delivery: 09/03/16 BP 120/84   Pulse 98   Wt 178 lb (80.7 kg)   LMP 11/28/2015 (Approximate)   BMI 31.53 kg/m   BP, weight, and urine reviewed.  Refer to obstetrical flow sheet for FH & FHR.  Reports good fm.  Denies regular uc's, lof, vb, or uti s/s. No complaints. Reviewed ptl s/s, fkc, pn2 results. Plan:  Continue routine obstetrical care  F/U in 2wks for OB appointment  Rhogam today

## 2016-07-10 NOTE — Patient Instructions (Signed)
Call the office ([phone]) or go to [facility] if:  You begin to have strong, frequent contractions  Your water breaks.  Sometimes it is a big gush of fluid, sometimes it is just a trickle that keeps getting your panties wet or running down your legs  You have vaginal bleeding.  It is normal to have a small amount of spotting if your cervix was checked.   You don't feel your baby moving like normal.  If you don't, get you something to eat and drink and lay down and focus on feeling your baby move.  You should feel at least 10 movements in 2 hours.  If you don't, you should call the office or go to [facility].     Preterm Labor and Birth Information The normal length of a pregnancy is 39-41 weeks. Preterm labor is when labor starts before 37 completed weeks of pregnancy. What are the risk factors for preterm labor? Preterm labor is more likely to occur in women who:  Have certain infections during pregnancy such as a bladder infection, sexually transmitted infection, or infection inside the uterus (chorioamnionitis).  Have a shorter-than-normal cervix.  Have gone into preterm labor before.  Have had surgery on their cervix.  Are younger than age 17 or older than age 35.  Are African American.  Are pregnant with twins or multiple babies (multiple gestation).  Take street drugs or smoke while pregnant.  Do not gain enough weight while pregnant.  Became pregnant shortly after having been pregnant.  What are the symptoms of preterm labor? Symptoms of preterm labor include:  Cramps similar to those that can happen during a menstrual period. The cramps may happen with diarrhea.  Pain in the abdomen or lower back.  Regular uterine contractions that may feel like tightening of the abdomen.  A feeling of increased pressure in the pelvis.  Increased watery or bloody mucus discharge from the vagina.  Water breaking (ruptured amniotic sac).  Why is it important to  recognize signs of preterm labor? It is important to recognize signs of preterm labor because babies who are born prematurely may not be fully developed. This can put them at an increased risk for:  Long-term (chronic) heart and lung problems.  Difficulty immediately after birth with regulating body systems, including blood sugar, body temperature, heart rate, and breathing rate.  Bleeding in the brain.  Cerebral palsy.  Learning difficulties.  Death.  These risks are highest for babies who are born before 34 weeks of pregnancy. How is preterm labor treated? Treatment depends on the length of your pregnancy, your condition, and the health of your baby. It may involve:  Having a stitch (suture) placed in your cervix to prevent your cervix from opening too early (cerclage).  Taking or being given medicines, such as: ? Hormone medicines. These may be given early in pregnancy to help support the pregnancy. ? Medicine to stop contractions. ? Medicines to help mature the baby's lungs. These may be prescribed if the risk of delivery is high. ? Medicines to prevent your baby from developing cerebral palsy.  If the labor happens before 34 weeks of pregnancy, you may need to stay in the hospital. What should I do if I think I am in preterm labor? If you think that you are going into preterm labor, call your health care provider right away. How can I prevent preterm labor in future pregnancies? To increase your chance of having a full-term pregnancy:  Do not use   any tobacco products, such as cigarettes, chewing tobacco, and e-cigarettes. If you need help quitting, ask your health care provider.  Do not use street drugs or medicines that have not been prescribed to you during your pregnancy.  Talk with your health care provider before taking any herbal supplements, even if you have been taking them regularly.  Make sure you gain a healthy amount of weight during your pregnancy.  Watch  for infection. If you think that you might have an infection, get it checked right away.  Make sure to tell your health care provider if you have gone into preterm labor before.  This information is not intended to replace advice given to you by your health care provider. Make sure you discuss any questions you have with your health care provider. Document Released: 03/23/2003 Document Revised: 06/13/2015 Document Reviewed: 05/24/2015 Elsevier Interactive Patient Education  2018 Elsevier Inc.  

## 2016-07-24 ENCOUNTER — Ambulatory Visit (INDEPENDENT_AMBULATORY_CARE_PROVIDER_SITE_OTHER): Payer: Medicaid Other | Admitting: Obstetrics and Gynecology

## 2016-07-24 ENCOUNTER — Encounter: Payer: Self-pay | Admitting: Obstetrics and Gynecology

## 2016-07-24 VITALS — BP 100/70 | HR 74 | Wt 180.2 lb

## 2016-07-24 DIAGNOSIS — O0930 Supervision of pregnancy with insufficient antenatal care, unspecified trimester: Secondary | ICD-10-CM

## 2016-07-24 DIAGNOSIS — Z331 Pregnant state, incidental: Secondary | ICD-10-CM

## 2016-07-24 DIAGNOSIS — Z3A34 34 weeks gestation of pregnancy: Secondary | ICD-10-CM

## 2016-07-24 DIAGNOSIS — Z1389 Encounter for screening for other disorder: Secondary | ICD-10-CM

## 2016-07-24 DIAGNOSIS — Z3403 Encounter for supervision of normal first pregnancy, third trimester: Secondary | ICD-10-CM

## 2016-07-24 DIAGNOSIS — Z3483 Encounter for supervision of other normal pregnancy, third trimester: Secondary | ICD-10-CM

## 2016-07-24 LAB — POCT URINALYSIS DIPSTICK
Blood, UA: NEGATIVE
GLUCOSE UA: NEGATIVE
Ketones, UA: NEGATIVE
LEUKOCYTES UA: NEGATIVE
NITRITE UA: NEGATIVE
Protein, UA: NEGATIVE

## 2016-07-24 NOTE — Progress Notes (Signed)
Jennifer Aguilar is a 21 y.o. female 43P2002  Estimated Date of Delivery: 09/03/16 [name] 2057w1d  No chief complaint on file. ____  Patient has reports of some occasional pain in the vaginal area. She has no other acute complaints or symptoms at this time. She states she will consider the IUD for PP BC. Patient reports good fetal movement,                          denies any bleeding , rupture of membranes,or regular contractions.  Last menstrual period 11/28/2015.   Urine results: negative refer to the ob flow sheet for FH and FHR, ,                          Physical Examination: General appearance - alert, well appearing, and in no distress                                      Abdomen - FH 34cm ,                                                         -FHR 156 bpm                                                         soft, nontender, nondistended, no masses or organomegaly                                      Pelvic - not indicated                                            Questions were answered. Assessment: [name] G3P2002 @ 757w1d Estimated Date of Delivery: 09/03/16  Plan:  Continued routine obstetrical care, [name]  F/u in 2 weeks for [name]  By signing my name below, I, Izna Ahmed, attest that this documentation has been prepared under the direction and in the presence of Tilda BurrowFerguson, Everitt Wenner V, MD. Electronically Signed: Redge GainerIzna Ahmed, ED Scribe. 07/24/16. 2:05 PM.  I personally performed the services described in this documentation, which was SCRIBED in my presence. The recorded information has been reviewed and considered accurate. It has been edited as necessary during review. Tilda BurrowFERGUSON,Ronisha Herringshaw V, MD

## 2016-08-07 ENCOUNTER — Encounter: Payer: Self-pay | Admitting: Obstetrics and Gynecology

## 2016-08-07 ENCOUNTER — Ambulatory Visit (INDEPENDENT_AMBULATORY_CARE_PROVIDER_SITE_OTHER): Payer: Medicaid Other | Admitting: Obstetrics and Gynecology

## 2016-08-07 VITALS — BP 120/68 | HR 110 | Wt 186.0 lb

## 2016-08-07 DIAGNOSIS — Z1389 Encounter for screening for other disorder: Secondary | ICD-10-CM

## 2016-08-07 DIAGNOSIS — Z331 Pregnant state, incidental: Secondary | ICD-10-CM

## 2016-08-07 DIAGNOSIS — O09293 Supervision of pregnancy with other poor reproductive or obstetric history, third trimester: Secondary | ICD-10-CM

## 2016-08-07 DIAGNOSIS — Z3A36 36 weeks gestation of pregnancy: Secondary | ICD-10-CM

## 2016-08-07 DIAGNOSIS — Z3483 Encounter for supervision of other normal pregnancy, third trimester: Secondary | ICD-10-CM

## 2016-08-07 DIAGNOSIS — O0933 Supervision of pregnancy with insufficient antenatal care, third trimester: Secondary | ICD-10-CM

## 2016-08-07 DIAGNOSIS — O0930 Supervision of pregnancy with insufficient antenatal care, unspecified trimester: Secondary | ICD-10-CM

## 2016-08-07 DIAGNOSIS — O09299 Supervision of pregnancy with other poor reproductive or obstetric history, unspecified trimester: Secondary | ICD-10-CM

## 2016-08-07 LAB — POCT URINALYSIS DIPSTICK
Glucose, UA: NEGATIVE
KETONES UA: NEGATIVE
LEUKOCYTES UA: NEGATIVE
NITRITE UA: NEGATIVE
Protein, UA: NEGATIVE
RBC UA: NEGATIVE

## 2016-08-07 NOTE — Progress Notes (Signed)
Ceasar Lundmber D Diodato is a 21 y.o. female G3P2002  Estimated Date of Delivery: 09/03/16 [name] 7219w1d  Chief Complaint  Patient presents with  . Routine Prenatal Visit  ____  Patient has no complaints. Pt will use IUD for PP BC. Patient reports  good fetal movement,                          denies any bleeding , rupture of membranes,or regular contractions.  Blood pressure 120/68, pulse (!) 110, weight 186 lb (84.4 kg), last menstrual period 11/28/2015.   Urine results: negative refer to the ob flow sheet for FH and FHR, ,                          Physical Examination: General appearance - alert, well appearing, and in no distress                                      Abdomen - FH 35 cm,                                                         -FHR 156 bpm                                                         soft, nontender, nondistended, no masses or organomegaly                                      Pelvic - not indicated                                            Questions were answered. Assessment: [name] G3P2002 @ 6719w1d Estimated Date of Delivery: 09/03/16  Plan:  Continued routine obstetrical care, [name]  F/u in 2 weeks for [name], U/S to check head placement of baby, GBS/GCh  By signing my name below, I, Izna Ahmed, attest that this documentation has been prepared under the direction and in the presence of Tilda BurrowFerguson, Jacquese Hackman V, MD. Electronically Signed: Redge GainerIzna Ahmed, ED Scribe. 08/07/16. 2:59 PM.  I personally performed the services described in this documentation, which was SCRIBED in my presence. The recorded information has been reviewed and considered accurate. It has been edited as necessary during review. Tilda BurrowFERGUSON,Debara Kamphuis V, MD

## 2016-08-12 ENCOUNTER — Inpatient Hospital Stay (HOSPITAL_COMMUNITY)
Admission: EM | Admit: 2016-08-12 | Discharge: 2016-08-12 | Disposition: A | Discharge status: Home / Self Care | Payer: Medicaid Other | Attending: Obstetrics & Gynecology | Admitting: Obstetrics & Gynecology

## 2016-08-12 ENCOUNTER — Encounter (HOSPITAL_COMMUNITY): Payer: Self-pay | Admitting: Adult Health

## 2016-08-12 DIAGNOSIS — O26893 Other specified pregnancy related conditions, third trimester: Secondary | ICD-10-CM

## 2016-08-12 DIAGNOSIS — N898 Other specified noninflammatory disorders of vagina: Secondary | ICD-10-CM

## 2016-08-12 DIAGNOSIS — O9989 Other specified diseases and conditions complicating pregnancy, childbirth and the puerperium: Secondary | ICD-10-CM

## 2016-08-12 DIAGNOSIS — Z3483 Encounter for supervision of other normal pregnancy, third trimester: Secondary | ICD-10-CM

## 2016-08-12 DIAGNOSIS — Z79899 Other long term (current) drug therapy: Secondary | ICD-10-CM | POA: Insufficient documentation

## 2016-08-12 DIAGNOSIS — Z3A36 36 weeks gestation of pregnancy: Secondary | ICD-10-CM | POA: Diagnosis not present

## 2016-08-12 DIAGNOSIS — O42913 Preterm premature rupture of membranes, unspecified as to length of time between rupture and onset of labor, third trimester: Secondary | ICD-10-CM | POA: Diagnosis not present

## 2016-08-12 DIAGNOSIS — O42019 Preterm premature rupture of membranes, onset of labor within 24 hours of rupture, unspecified trimester: Secondary | ICD-10-CM | POA: Diagnosis present

## 2016-08-12 DIAGNOSIS — IMO0002 Reserved for concepts with insufficient information to code with codable children: Secondary | ICD-10-CM

## 2016-08-12 LAB — POCT FERN TEST: POCT Fern Test: NEGATIVE

## 2016-08-12 NOTE — Discharge Instructions (Signed)
Premature Rupture and Preterm Premature Rupture of Membranes °A sac made up of membranes surrounds your baby in the womb (uterus). Rupture of membranes is when this sac breaks open. This is also known as your "water breaking." When this sac breaks before labor starts, it is called premature rupture of membranes (PROM). If this happens before 37 weeks of being pregnant, it is called preterm premature rupture of membranes (PPROM). PPROM is serious. It needs medical care right away. °What increases the risk of PPROM? °PPROM is more likely to happen in women who: °· Have an infection. °· Have had PPROM before. °· Have a cervix that is short. °· Have bleeding during the second or third trimester. °· Have a low BMI. This is a measure of body fat. °· Smoke. °· Use drugs. °· Have a low socioeconomic status. ° °What problems can be caused by PROM and PPROM? °This condition creates health dangers for the mother and the baby. These include: °· Giving birth to the baby too early (prematurely). °· Getting a serious infection of the placenta (chorioamnionitis). °· Having the placenta detach from the uterus early (placental abruption). °· Squeezing of the umbilical cord. °· Getting a serious infection after delivery. ° °What are the signs of PROM and PPROM? °· A sudden gush of fluid from the vagina. °· A slow leak of fluid from the vagina. °· Your underwear is wet. °What should I do if I think my water broke? °Call your doctor right away. You will need to go to the hospital to get checked right away. °What happens if I am told that I have PROM or PPROM? °You will have tests done at the hospital. °· If you have PROM, you may be given medicine to start labor (be induced). This may be done if you are not having contractions during the 24 hours after your water broke. °· If you have PPROM and are not having contractions, you may be given medicine to start labor. It will depend on how far along you are in your pregnancy. ° °If you have  PPROM: °· You and your baby will be watched closely to see if you have infections or other problems. °· You may be given: °? An antibiotic medicine. This can stop an infection from starting. °? A steroid medicine. This can help your baby's lungs develop faster. °? A medicine to help prevent cerebral palsy in your baby. °? A medicine to stop early labor (preterm labor). °· You may be told to stay in bed except to use the bathroom (bed rest). °· You may be given medicine to start labor. This may be done if there are problems with you or the baby. ° °Your treatment will depend on many factors. °Contact a doctor if: °· Your water breaks and you are not having contractions. °Get help right away if: °· Your water breaks before you are [redacted] weeks pregnant. °Summary °· When your water breaks before labor starts, it is called premature rupture of membranes (PROM). °· When PROM happens before 37 weeks of pregnancy, it is called preterm premature rupture of membranes (PPROM). °· If you are not having contractions, your labor may be started for you. °This information is not intended to replace advice given to you by your health care provider. Make sure you discuss any questions you have with your health care provider. °Document Released: 03/29/2008 Document Revised: 09/21/2015 Document Reviewed: 09/21/2015 °Elsevier Interactive Patient Education © 2017 Elsevier Inc. ° °

## 2016-08-12 NOTE — ED Notes (Signed)
Corrie DandyMary, [place] notified of pt status.

## 2016-08-12 NOTE — Progress Notes (Signed)
Pt reports intercourse last night

## 2016-08-12 NOTE — MAU Note (Addendum)
Pt arrived from [facility] via [facility]. Pt denies pain. Reports SROM @ 1230 today. Pt had a gush of fluid @ that time, but little leaking since.

## 2016-08-12 NOTE — ED Notes (Signed)
Spoke with [facility], who will dispatch [facility]

## 2016-08-12 NOTE — Progress Notes (Signed)
Received call from [facility] RN. Pt is a G3P2 at 4936 6/[redacted] weeks gestation presents with rom around 1300. Says pt had a gush of clear fluid with some clots. Says the ED MD has checked the pt's cervix and she is 2cm. ? Breech presentation per ED staff. [facility]Says that the ED MD has already spoken to Dr. Debroah LoopArnold and the pt is going to be transferred here to [facility].

## 2016-08-12 NOTE — ED Notes (Signed)
Pt leaving with [facility] at this time.  [facility] given EMTALA, Med necessity, facesheet, e-signature, and [facility] transfer .  Pt wnl upon discharge, FHT 145.  Pt in no pain.

## 2016-08-12 NOTE — Progress Notes (Signed)
Spoke with Eligha BridegroomAnnette Milner RN in triage. Pt is a G3P2 at 3736 6/[redacted] weeks gestation presenting at [facility] with rom around 1300 today. Says pt had a gush of clear fluid with some clots. ED MD checked pt's cervix and says she is 2cm and ? Breech. Pt is to be transferred to [facility]U.

## 2016-08-12 NOTE — ED Triage Notes (Addendum)
PResents [redacted] weeks pregnant, gravida 3 para 2-bag of waters broke about 20 minutes ago, while sitting in bed had gush of water and blood-other 2 children were normal healthy live pregnancies-she denies contractions but reports that she did not have contractions bad with last labor. OB Dr. Emelda FearFerguson

## 2016-08-12 NOTE — Progress Notes (Signed)
Spoke with Dr. Debroah LoopArnold. Pt is to be transferred here to [facility]U.

## 2016-08-12 NOTE — MAU Provider Note (Signed)
Patient Jennifer Aguilar is a 21 y.o. G3P2002 At 6428w6d here with complaints of vaginal discharge. She reports a gush of fluid around 12:30 today but no leaking of fluid since then. Denies bleeding or decreased fetal movements.  History     CSN: [id number]  Arrival date and time: 08/12/16 1306   First Provider Initiated Contact with Patient 08/12/16 1548      Chief Complaint  Patient presents with  . Laboring   Vaginal Discharge  The patient's primary symptoms include vaginal discharge. The patient's pertinent negatives include no genital itching or genital lesions. This is a new problem. The current episode started today. The problem occurs rarely. The problem has been resolved. The patient is experiencing no pain. The vaginal discharge was watery. There has been no bleeding. She has not been passing clots. She has not been passing tissue.    OB History    Gravida Para Term Preterm AB Living   3 2 2     2    SAB TAB Ectopic Multiple Live Births           2      Past Medical History:  Diagnosis Date  . Seasonal allergies     Past Surgical History:  Procedure Laterality Date  . NO PAST SURGERIES      Family History  Problem Relation Age of Onset  . Diabetes Mother   . Hypertension Mother   . Hypertension Father   . Heart failure Maternal Grandfather   . Hypertension Sister   . Hypertension Paternal Grandfather   . Heart failure Maternal Grandmother   . Diabetes Maternal Grandmother   . Cancer Paternal Aunt   . Hypertension Paternal Uncle     Social History  Substance Use Topics  . Smoking status: Never Smoker  . Smokeless tobacco: Never Used  . Alcohol use No    Allergies:  Allergies  Allergen Reactions  . Meloxicam     REACTION: increased heart rate, body numbness; tolerates ibuprofen, per pt    Prescriptions Prior to Admission  Medication Sig Dispense Refill Last Dose  . Prenatal Vit-Fe Fumarate-FA (PRENATAL PLUS/IRON) 27-1 MG TABS Take 1 daily 30 each 12  08/11/2016 at Unknown time    Review of Systems  Respiratory: Negative.   Cardiovascular: Negative.   Gastrointestinal: Negative.   Genitourinary: Positive for vaginal discharge.  Neurological: Negative.   Psychiatric/Behavioral: Negative.    Physical Exam   Blood pressure 123/73, pulse 92, temperature 98.8 F (37.1 C), temperature source Oral, resp. rate 18, height 5\' 5"  (1.651 m), weight 186 lb (84.4 kg), last menstrual period 11/28/2015, SpO2 100 %.  Physical Exam  Constitutional: She is oriented to person, place, and time. She appears well-developed and well-nourished.  Neck: Normal range of motion.  Respiratory: Effort normal.  GI: Soft.  Genitourinary:  Genitourinary Comments: NEFG; cervix is 1.5 cm dilated; on sterile spec exam no pooling is observed and no fluid was observed on her pad or labia/thighs.   Musculoskeletal: Normal range of motion.  Neurological: She is alert and oriented to person, place, and time.  Skin: Skin is warm and dry.  Psychiatric: She has a normal mood and affect.    MAU Course  Procedures  MDM -Sterile fern test negative -NST: 130 bpm with mod var, no decels, present acels, no contractions   Assessment and Plan   1. Rupture of membranes with clear amniotic fluid   2. Encounter for supervision of other normal pregnancy in third trimester  3. Vaginal discharge during pregnancy in third trimester    2. Patient stable for discharge; reviewed warning signs and when to return to the MAU.  3. Patient to keep her prenatal visit on 08-15-2016.   Charlesetta GaribaldiKathryn Lorraine Jeannene Tschetter CNM 08/12/2016, 4:06 PM

## 2016-08-12 NOTE — ED Provider Notes (Signed)
[facility] Provider Note   CSN: [id number] Arrival date & time: 08/12/16  1306     History   Chief Complaint Chief Complaint  Patient presents with  . Laboring    HPI Ceasar Lundmber D Mecca is a 21 y.o. female.  HPI Patient presents complaining that her water broke. She is 36 weeks and 6 days pregnant. States that she was in bed she had a gush of water and blood out. Mostly water. No abdominal pain. Still feeling baby move. Sees Dr. Emelda FearFerguson. Last visit a couple months ago baby was breech. She is G3 P2. States that blood has stopped. Past Medical History:  Diagnosis Date  . Seasonal allergies     Patient Active Problem List   Diagnosis Date Noted  . Abnormal Pap smear of cervix 05/15/2016  . Supervision of normal pregnancy 05/09/2016  . Late prenatal care 05/09/2016  . Hx of preeclampsia, prior pregnancy, currently pregnant 05/09/2016  . Antenatal screening for malformation using ultrasonics 04/24/2016  . Menometrorrhagia 08/29/2014  . History of chlamydia 11/07/2012  . Rh negative, antepartum 11/07/2012  . 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 PATELLO-FEMORAL SYNDROME 06/01/2008    Past Surgical History:  Procedure Laterality Date  . NO PAST SURGERIES      OB History    Gravida Para Term Preterm AB Living   3 2 2     2    SAB TAB Ectopic Multiple Live Births           2       Home Medications    Prior to Admission medications   Medication Sig Start Date End Date Taking? Authorizing Provider  Prenatal Vit-Fe Fumarate-FA (PRENATAL PLUS/IRON) 27-1 MG TABS Take 1 daily 04/24/16   Adline PotterGriffin, Jennifer A, NP    Family History Family History  Problem Relation Age of Onset  . Diabetes Mother   . Hypertension Mother   . Hypertension Father   . Heart failure Maternal Grandfather   . Hypertension Sister   . Hypertension Paternal Grandfather   . Heart failure Maternal Grandmother   . Diabetes Maternal Grandmother   . Cancer Paternal Aunt   . Hypertension  Paternal Uncle     Social History Social History  Substance Use Topics  . Smoking status: Never Smoker  . Smokeless tobacco: Never Used  . Alcohol use No     Allergies   Meloxicam   Review of Systems Review of Systems  Constitutional: Negative for appetite change.  HENT: Negative for congestion.   Respiratory: Positive for shortness of breath.   Gastrointestinal: Negative for abdominal pain.  Genitourinary: Positive for vaginal bleeding. Negative for difficulty urinating.  Musculoskeletal: Negative for back pain.  Skin: Negative for rash.  Neurological: Negative for weakness and numbness.  Hematological: Negative for adenopathy.     Physical Exam Updated Vital Signs BP (!) 136/91   Pulse 95   Temp 98.5 F (36.9 C) (Oral)   Resp (!) 22   LMP 11/28/2015 (Approximate)   SpO2 100%   Physical Exam  Constitutional: She appears well-developed.  HENT:  Head: Atraumatic.  Eyes: EOM are normal.  Neck: Normal range of motion.  Cardiovascular: Normal rate.   Pulmonary/Chest: Effort normal.  Abdominal: She exhibits mass.  Gravid to well above umbilicus. No tenderness.  Genitourinary:  Genitourinary Comments: Gravid to well above umbilicus. Cervix is soft and rather anterior and high. Likely under 2 cm dilatation.  Neurological: She is alert.  Skin: Skin is warm. Capillary refill takes less than 2  seconds.  Psychiatric: She has a normal mood and affect.     ED Treatments / Results  Labs (all labs ordered are listed, but only abnormal results are displayed) Labs Reviewed - No data to display  EKG  EKG Interpretation None       Radiology No results found.  Procedures Procedures (including critical care time)  Medications Ordered in ED Medications - No data to display   Initial Impression / Assessment and Plan / ED Course  I have reviewed the triage vital signs and the nursing notes.  Pertinent labs & imaging results that were available during my care  of the patient were reviewed by me and considered in my medical decision making (see chart for details).     Patient with likely rupture of membranes. Cervix check was brought for 2 cm and very high. Discussed with Dr. Debroah LoopArnold who accepted patient for transfer to MAU. Will transfer by ambulance for [facility] patient does not appear to be in active labor at this time.  Final Clinical Impressions(s) / ED Diagnoses   Final diagnoses:  Rupture of membranes with clear amniotic fluid    New Prescriptions New Prescriptions   No medications on file     Benjiman CorePickering, Yamel Bale, MD 08/12/16 1348

## 2016-08-15 ENCOUNTER — Encounter: Payer: Self-pay | Admitting: Obstetrics and Gynecology

## 2016-08-15 ENCOUNTER — Ambulatory Visit (INDEPENDENT_AMBULATORY_CARE_PROVIDER_SITE_OTHER): Payer: Medicaid Other | Admitting: Obstetrics and Gynecology

## 2016-08-15 VITALS — BP 118/60 | HR 107 | Wt 196.0 lb

## 2016-08-15 DIAGNOSIS — Z331 Pregnant state, incidental: Secondary | ICD-10-CM

## 2016-08-15 DIAGNOSIS — Z1389 Encounter for screening for other disorder: Secondary | ICD-10-CM

## 2016-08-15 DIAGNOSIS — Z3A37 37 weeks gestation of pregnancy: Secondary | ICD-10-CM

## 2016-08-15 DIAGNOSIS — Z3483 Encounter for supervision of other normal pregnancy, third trimester: Secondary | ICD-10-CM

## 2016-08-15 LAB — POCT URINALYSIS DIPSTICK
Glucose, UA: NEGATIVE
KETONES UA: NEGATIVE
Leukocytes, UA: NEGATIVE
Nitrite, UA: NEGATIVE
RBC UA: NEGATIVE

## 2016-08-15 LAB — OB RESULTS CONSOLE GBS: GBS: POSITIVE

## 2016-08-15 NOTE — Progress Notes (Signed)
Jennifer Aguilar is a 21 y.o. female G3P2002  [facility]  Chief Complaint  Patient presents with  . Routine Prenatal Visit    GBS, GC/CHL; went to ER Tuesday with bleeding   ____  Patient has no complaints today. She was seen in ER for bleeding on Tuesday, cervix was checked then and it was at 2 cm.  Patient reports good fetal movement,                           denies any bleeding , rupture of membranes,or regular contractions.  Blood pressure 118/60, pulse (!) 107, weight 196 lb (88.9 kg), last menstrual period 11/28/2015.   Urine results: trace proteins refer to the ob flow sheet for FH and FHR, ,                          Physical Examination: General appearance - alert, well appearing, and in no distress                                      Abdomen - FH 36 cm ,                                                        -FHR 167 bpm                                                         soft, nontender, nondistended, no masses or organomegaly                                      Pelvic - not indicated GBS and GC/CHL taken                                            Questions were answered. Assessment: [name] [id number] @ 2257w2d Estimated Date of Delivery: 09/03/16  Plan:  Continued routine obstetrical care, [name]  F/u in 1 weeks for [name]   By signing my name below, I, Izna Ahmed, attest that this documentation has been prepared under the direction and in the presence of Tilda BurrowFerguson, Breindy Meadow V, MD. Electronically Signed: Redge GainerIzna Ahmed, Medical Scribe. 08/15/16. 2:52 PM.  I personally performed the services described in this documentation, which was SCRIBED in my presence. The recorded information has been reviewed and considered accurate. It has been edited as necessary during review. Tilda BurrowFERGUSON,Brodan Grewell V, MD

## 2016-08-17 LAB — STREP GP B NAA: STREP GROUP B AG: POSITIVE — AB

## 2016-08-18 LAB — GC/CHLAMYDIA PROBE AMP
CHLAMYDIA, DNA PROBE: NEGATIVE
Neisseria gonorrhoeae by PCR: NEGATIVE

## 2016-08-22 ENCOUNTER — Encounter: Payer: Self-pay | Admitting: Women's Health

## 2016-08-22 ENCOUNTER — Ambulatory Visit (INDEPENDENT_AMBULATORY_CARE_PROVIDER_SITE_OTHER): Payer: Medicaid Other | Admitting: Women's Health

## 2016-08-22 VITALS — BP 118/64 | HR 106 | Wt 196.0 lb

## 2016-08-22 DIAGNOSIS — Z331 Pregnant state, incidental: Secondary | ICD-10-CM

## 2016-08-22 DIAGNOSIS — Z3A38 38 weeks gestation of pregnancy: Secondary | ICD-10-CM

## 2016-08-22 DIAGNOSIS — Z1389 Encounter for screening for other disorder: Secondary | ICD-10-CM

## 2016-08-22 DIAGNOSIS — Z3483 Encounter for supervision of other normal pregnancy, third trimester: Secondary | ICD-10-CM

## 2016-08-22 LAB — POCT URINALYSIS DIPSTICK
Glucose, UA: NEGATIVE
Ketones, UA: NEGATIVE
NITRITE UA: NEGATIVE
Protein, UA: NEGATIVE
RBC UA: NEGATIVE

## 2016-08-22 NOTE — Patient Instructions (Signed)
Call the office ([phone]) or go to [facility] if:  You begin to have strong, frequent contractions  Your water breaks.  Sometimes it is a big gush of fluid, sometimes it is just a trickle that keeps getting your panties wet or running down your legs  You have vaginal bleeding.  It is normal to have a small amount of spotting if your cervix was checked.   You don't feel your baby moving like normal.  If you don't, get you something to eat and drink and lay down and focus on feeling your baby move.  You should feel at least 10 movements in 2 hours.  If you don't, you should call the office or go to [facility].     Braxton Hicks Contractions Contractions of the uterus can occur throughout pregnancy, but they are not always a sign that you are in labor. You may have practice contractions called Braxton Hicks contractions. These false labor contractions are sometimes confused with true labor. What are Braxton Hicks contractions? Braxton Hicks contractions are tightening movements that occur in the muscles of the uterus before labor. Unlike true labor contractions, these contractions do not result in opening (dilation) and thinning of the cervix. Toward the end of pregnancy (32-34 weeks), Braxton Hicks contractions can happen more often and may become stronger. These contractions are sometimes difficult to tell apart from true labor because they can be very uncomfortable. You should not feel embarrassed if you go to the hospital with false labor. Sometimes, the only way to tell if you are in true labor is for your health care provider to look for changes in the cervix. The health care provider will do a physical exam and may monitor your contractions. If you are not in true labor, the exam should show that your cervix is not dilating and your water has not broken. If there are no prenatal problems or other health problems associated with your pregnancy, it is completely safe for you to be sent  home with false labor. You may continue to have Braxton Hicks contractions until you go into true labor. How can I tell the difference between true labor and false labor?  Differences ? False labor ? Contractions last 30-70 seconds.: Contractions are usually shorter and not as strong as true labor contractions. ? Contractions become very regular.: Contractions are usually irregular. ? Discomfort is usually felt in the top of the uterus, and it spreads to the lower abdomen and low back.: Contractions are often felt in the front of the lower abdomen and in the groin. ? Contractions do not go away with walking.: Contractions may go away when you walk around or change positions while lying down. ? Contractions usually become more intense and increase in frequency.: Contractions get weaker and are shorter-lasting as time goes on. ? The cervix dilates and gets thinner.: The cervix usually does not dilate or become thin. Follow these instructions at home:  Take over-the-counter and prescription medicines only as told by your health care provider.  Keep up with your usual exercises and follow other instructions from your health care provider.  Eat and drink lightly if you think you are going into labor.  If Braxton Hicks contractions are making you uncomfortable: ? Change your position from lying down or resting to walking, or change from walking to resting. ? Sit and rest in a tub of warm water. ? Drink enough fluid to keep your urine clear or pale yellow. Dehydration may cause these contractions. ?   Do slow and deep breathing several times an hour.  Keep all follow-up prenatal visits as told by your health care provider. This is important. Contact a health care provider if:  You have a fever.  You have continuous pain in your abdomen. Get help right away if:  Your contractions become stronger, more regular, and closer together.  You have fluid leaking or gushing from your vagina.  You  pass blood-tinged mucus (bloody show).  You have bleeding from your vagina.  You have low back pain that you never had before.  You feel your baby's head pushing down and causing pelvic pressure.  Your baby is not moving inside you as much as it used to. Summary  Contractions that occur before labor are called Braxton Hicks contractions, false labor, or practice contractions.  Braxton Hicks contractions are usually shorter, weaker, farther apart, and less regular than true labor contractions. True labor contractions usually become progressively stronger and regular and they become more frequent.  Manage discomfort from Braxton Hicks contractions by changing position, resting in a warm bath, drinking plenty of water, or practicing deep breathing. This information is not intended to replace advice given to you by your health care provider. Make sure you discuss any questions you have with your health care provider. Document Released: 12/31/2004 Document Revised: 11/20/2015 Document Reviewed: 11/20/2015 Elsevier Interactive Patient Education  2017 [employer]vier Inc.  

## 2016-08-22 NOTE — Progress Notes (Signed)
Low-risk OB appointment [id number] 129w2d Estimated Date of Delivery: 09/03/16 BP 118/64   Pulse (!) 106   Wt 196 lb (88.9 kg)   LMP 11/28/2015 (Approximate)   BMI 32.62 kg/m   BP, weight, and urine reviewed.  Refer to obstetrical flow sheet for FH & FHR.  Reports good fm.  Denies regular uc's, lof, vb, or uti s/s. No complaints. SVE per request: tight 3/50/-2, vtx Reviewed labor s/s, fkc. Plan:  Continue routine obstetrical care  F/U in 1wk for OB appointment

## 2016-08-29 ENCOUNTER — Ambulatory Visit (INDEPENDENT_AMBULATORY_CARE_PROVIDER_SITE_OTHER): Payer: Medicaid Other | Admitting: Advanced Practice Midwife

## 2016-08-29 ENCOUNTER — Encounter: Payer: Self-pay | Admitting: Advanced Practice Midwife

## 2016-08-29 VITALS — BP 124/72 | HR 100 | Wt 197.0 lb

## 2016-08-29 DIAGNOSIS — O09893 Supervision of other high risk pregnancies, third trimester: Secondary | ICD-10-CM

## 2016-08-29 DIAGNOSIS — Z331 Pregnant state, incidental: Secondary | ICD-10-CM

## 2016-08-29 DIAGNOSIS — O480 Post-term pregnancy: Secondary | ICD-10-CM

## 2016-08-29 DIAGNOSIS — Z1389 Encounter for screening for other disorder: Secondary | ICD-10-CM

## 2016-08-29 DIAGNOSIS — Z3483 Encounter for supervision of other normal pregnancy, third trimester: Secondary | ICD-10-CM

## 2016-08-29 DIAGNOSIS — Z3A39 39 weeks gestation of pregnancy: Secondary | ICD-10-CM

## 2016-08-29 LAB — POCT URINALYSIS DIPSTICK
Blood, UA: NEGATIVE
Glucose, UA: NEGATIVE
Ketones, UA: NEGATIVE
LEUKOCYTES UA: NEGATIVE
Nitrite, UA: NEGATIVE
PROTEIN UA: NEGATIVE

## 2016-08-29 NOTE — Patient Instructions (Signed)

## 2016-08-29 NOTE — Progress Notes (Signed)
[id number] 8135w2d Estimated Date of Delivery: 09/03/16  Blood pressure 124/72, pulse 100, weight 197 lb (89.4 kg), last menstrual period 11/28/2015.   BP weight and urine results all reviewed and noted.  Please refer to the obstetrical flow sheet for the fundal height and fetal heart rate documentation:  Patient reports good fetal movement, denies any bleeding and no rupture of membranes symptoms or regular contractions. Patient is without complaints. All questions were answered.  Orders Placed This Encounter  Procedures  . US FETAL BPP WO NON STRESS  . POCT urinalysis dipstick    Plan:  Continued routine obstetrical care  Return in about 1 week (around 09/05/2016) for [name], US:BPP.

## 2016-09-06 ENCOUNTER — Encounter: Payer: Self-pay | Admitting: Women's Health

## 2016-09-06 ENCOUNTER — Inpatient Hospital Stay (HOSPITAL_COMMUNITY)
Admission: AD | Admit: 2016-09-06 | Discharge: 2016-09-08 | DRG: 775 | Disposition: A | Discharge status: Home / Self Care | Payer: Medicaid Other | Source: Ambulatory Visit | Attending: Obstetrics and Gynecology | Admitting: Obstetrics and Gynecology

## 2016-09-06 ENCOUNTER — Ambulatory Visit (INDEPENDENT_AMBULATORY_CARE_PROVIDER_SITE_OTHER): Payer: Medicaid Other

## 2016-09-06 ENCOUNTER — Ambulatory Visit (INDEPENDENT_AMBULATORY_CARE_PROVIDER_SITE_OTHER): Payer: Medicaid Other | Admitting: Women's Health

## 2016-09-06 ENCOUNTER — Encounter (HOSPITAL_COMMUNITY): Payer: Self-pay | Admitting: *Deleted

## 2016-09-06 DIAGNOSIS — O26893 Other specified pregnancy related conditions, third trimester: Secondary | ICD-10-CM | POA: Diagnosis present

## 2016-09-06 DIAGNOSIS — Z3A40 40 weeks gestation of pregnancy: Secondary | ICD-10-CM

## 2016-09-06 DIAGNOSIS — O1213 Gestational proteinuria, third trimester: Secondary | ICD-10-CM

## 2016-09-06 DIAGNOSIS — O4100X0 Oligohydramnios, unspecified trimester, not applicable or unspecified: Secondary | ICD-10-CM

## 2016-09-06 DIAGNOSIS — Z6791 Unspecified blood type, Rh negative: Secondary | ICD-10-CM

## 2016-09-06 DIAGNOSIS — O99824 Streptococcus B carrier state complicating childbirth: Secondary | ICD-10-CM | POA: Diagnosis present

## 2016-09-06 DIAGNOSIS — O4103X0 Oligohydramnios, third trimester, not applicable or unspecified: Secondary | ICD-10-CM

## 2016-09-06 DIAGNOSIS — O359XX0 Maternal care for (suspected) fetal abnormality and damage, unspecified, not applicable or unspecified: Secondary | ICD-10-CM | POA: Diagnosis not present

## 2016-09-06 DIAGNOSIS — Z3403 Encounter for supervision of normal first pregnancy, third trimester: Secondary | ICD-10-CM

## 2016-09-06 DIAGNOSIS — O480 Post-term pregnancy: Secondary | ICD-10-CM

## 2016-09-06 DIAGNOSIS — Z331 Pregnant state, incidental: Secondary | ICD-10-CM

## 2016-09-06 DIAGNOSIS — Z1389 Encounter for screening for other disorder: Secondary | ICD-10-CM

## 2016-09-06 HISTORY — DX: Excessive and frequent menstruation with irregular cycle: N92.1

## 2016-09-06 LAB — POCT URINALYSIS DIPSTICK
Blood, UA: NEGATIVE
Glucose, UA: NEGATIVE
KETONES UA: NEGATIVE
Leukocytes, UA: NEGATIVE
Nitrite, UA: NEGATIVE
PROTEIN UA: 1

## 2016-09-06 LAB — CBC
HEMATOCRIT: 38.6 % (ref 36.0–46.0)
Hemoglobin: 12.8 g/dL (ref 12.0–15.0)
MCH: 29.6 pg (ref 26.0–34.0)
MCHC: 33.2 g/dL (ref 30.0–36.0)
MCV: 89.1 fL (ref 78.0–100.0)
Platelets: 221 10*3/uL (ref 150–400)
RBC: 4.33 MIL/uL (ref 3.87–5.11)
RDW: 13.4 % (ref 11.5–15.5)
WBC: 10.9 10*3/uL — AB (ref 4.0–10.5)

## 2016-09-06 LAB — TYPE AND SCREEN
ABO/RH(D): O NEG
Antibody Screen: NEGATIVE

## 2016-09-06 MED ORDER — TERBUTALINE SULFATE 1 MG/ML IJ SOLN
0.2500 mg | Freq: Once | INTRAMUSCULAR | Status: DC | PRN
  Filled 2016-09-06: qty 1

## 2016-09-06 MED ORDER — OXYCODONE-ACETAMINOPHEN 5-325 MG PO TABS: 2.0000 | ORAL_TABLET | ORAL | Status: DC | PRN

## 2016-09-06 MED ORDER — LACTATED RINGERS IV SOLN: 500.0000 mL | INTRAVENOUS | Status: DC | PRN

## 2016-09-06 MED ORDER — ACETAMINOPHEN 325 MG PO TABS: 650.0000 mg | ORAL_TABLET | ORAL | Status: DC | PRN

## 2016-09-06 MED ORDER — LIDOCAINE HCL (PF) 1 % IJ SOLN
30.0000 mL | INTRAMUSCULAR | Status: DC | PRN
  Filled 2016-09-06: qty 30

## 2016-09-06 MED ORDER — ONDANSETRON HCL 4 MG/2ML IJ SOLN: 4.0000 mg | Freq: Four times a day (QID) | INTRAMUSCULAR | Status: DC | PRN

## 2016-09-06 MED ORDER — LACTATED RINGERS IV SOLN
INTRAVENOUS | Status: DC
  Administered 2016-09-06: 21:00:00 via INTRAVENOUS

## 2016-09-06 MED ORDER — OXYCODONE-ACETAMINOPHEN 5-325 MG PO TABS: 1.0000 | ORAL_TABLET | ORAL | Status: DC | PRN

## 2016-09-06 MED ORDER — OXYTOCIN 40 UNITS IN LACTATED RINGERS INFUSION - SIMPLE MED
1.0000 m[IU]/min | INTRAVENOUS | Status: DC
  Administered 2016-09-06: 2 m[IU]/min via INTRAVENOUS
  Filled 2016-09-06: qty 1000

## 2016-09-06 MED ORDER — OXYTOCIN 40 UNITS IN LACTATED RINGERS INFUSION - SIMPLE MED: 2.5000 [IU]/h | INTRAVENOUS | Status: DC

## 2016-09-06 MED ORDER — SOD CITRATE-CITRIC ACID 500-334 MG/5ML PO SOLN: 30.0000 mL | ORAL | Status: DC | PRN

## 2016-09-06 MED ORDER — FENTANYL CITRATE (PF) 100 MCG/2ML IJ SOLN: 100.0000 ug | INTRAMUSCULAR | Status: DC | PRN

## 2016-09-06 MED ORDER — OXYTOCIN BOLUS FROM INFUSION
500.0000 mL | Freq: Once | INTRAVENOUS | Status: AC
  Administered 2016-09-07: 500 mL via INTRAVENOUS

## 2016-09-06 NOTE — Progress Notes (Signed)
High Risk Pregnancy Diagnosis(es): oligohydramnios dx today, also slightly enlarged heart on u/s G3P2002 [redacted]w[redacted]d Estimated Date of Delivery: 09/03/16 BP 110/70   Pulse 76   Wt 195 lb (88.5 kg)   LMP 11/28/2015 (Approximate)   BMI 32.45 kg/m   Urinalysis: Positive for 1+ protein HPI:  Doing well BP, weight, and urine reviewed.  Reports good fm. Denies regular uc's, lof, vb, uti s/s. No complaints.  Fundal Height:  39 Fetal Heart rate:  169 u/s Edema:  none  Reviewed today's bpp u/s done for postdates: afi 2.2cm=oligohydramnios- pt denies any lof. Also slightly enlarged heart- new finding. Images reviewed by JVF.  All questions were answered Assessment: [redacted]w[redacted]d oligohydramnios dx today, also slightly enlarged heart on u/s Medication(s) Plans:  n/a Treatment Plan:  To [facility] for direct admit/IOL for oligo/postdates, notified L&D and Dr. Frances Furbish, resident to expect pt Follow up in 4wks for pp visit

## 2016-09-06 NOTE — Anesthesia Pain Management Evaluation Note (Signed)
  CRNA Pain Management Visit Note  Patient: Jennifer Aguilar, 21 y.o., female  "Hello I am a member of the anesthesia team at [facility]. We have an anesthesia team available at all times to provide care throughout the hospital, including epidural management and anesthesia for C-section. I don't know your plan for the delivery whether it a natural birth, water birth, IV sedation, nitrous supplementation, doula or epidural, but we want to meet your pain goals."   1.Was your pain managed to your expectations on prior hospitalizations?   Yes   2.What is your expectation for pain management during this hospitalization?     Labor support without medications  3.How can we help you reach that goal?   Record the patient's initial score and the patient's pain goal.   Pain: 2  Pain Goal: 4 [facility] wants you to be able to say your pain was always managed very well.  [facility] 09/06/2016

## 2016-09-06 NOTE — Progress Notes (Signed)
[place] 40+3 wks,BPP 6/8 (fluid),afi 2.2 cm (total),cephalic,ant pl gr gr 3,fhr 169 bpm

## 2016-09-06 NOTE — H&P (Signed)
LABOR AND DELIVERY ADMISSION HISTORY AND PHYSICAL NOTE  Jennifer Aguilar is a 21 y.o. female G17P2002 with IUP at [redacted]w[redacted]d by LMP and 2nd trimester [place] presenting for IOL due to oligohydramnios with AFI 2.2 in office today.   She reports the [place] technician also told her the baby's heart looks enlarged on [place] which is a new finding.   She reports positive fetal movement. She denies leakage of fluid or vaginal bleeding. She feels a lot of pressure. No contractions.   Prenatal History/Complications:  Past Medical History: Past Medical History:  Diagnosis Date  . Seasonal allergies     Past Surgical History: Past Surgical History:  Procedure Laterality Date  . NO PAST SURGERIES      Obstetrical History: OB History    Gravida Para Term Preterm AB Living   3 2 2     2    SAB TAB Ectopic Multiple Live Births           2      Social History: Social History   Social History  . Marital status: Single    Spouse name: N/A  . Number of children: N/A  . Years of education: N/A   Social History Main Topics  . Smoking status: Never Smoker  . Smokeless tobacco: Never Used  . Alcohol use No  . Drug use: No  . Sexual activity: Yes    Birth control/ protection: None   Other Topics Concern  . None   Social History Narrative  . None    Family History: Family History  Problem Relation Age of Onset  . Diabetes Mother   . Hypertension Mother   . Hypertension Father   . Heart failure Maternal Grandfather   . Hypertension Sister   . Hypertension Paternal Grandfather   . Heart failure Maternal Grandmother   . Diabetes Maternal Grandmother   . Cancer Paternal Aunt   . Hypertension Paternal Uncle     Allergies: Allergies  Allergen Reactions  . Meloxicam     REACTION: increased heart rate, body numbness; tolerates ibuprofen, per pt    Prescriptions Prior to Admission  Medication Sig Dispense Refill Last Dose  . [DISCONTINUED] Prenatal Vit-Fe Fumarate-FA (PRENATAL PLUS/IRON)  27-1 MG TABS Take 1 daily 30 each 12 Taking     Review of Systems   All systems reviewed and negative except as stated in HPI  Last menstrual period 11/28/2015. General appearance: alert, cooperative and no distress Lungs: no respiratory distress Heart: regular rate Abdomen: soft, non-tender Extremities: No calf swelling or tenderness Presentation: cephalic by US Fetal monitoring: baseline 145, moderate variability, acceleration spresent, no decels Uterine activity: none    Prenatal labs: ABO, Rh: O/Negative/-- (04/26 1538) Antibody: Negative (04/26 1538) Rubella: immune RPR: Non Reactive (04/26 1538)  HBsAg: Negative (04/26 1538)  HIV:   NR GBS: Positive (08/02 1600)  Genetic screening:  Too late Anatomy US: normal female  Prenatal Transfer Tool  Maternal Diabetes: No Genetic Screening: Normal Maternal Ultrasounds/Referrals: Abnormal:  Findings:   Other: oligo, possible enlarged fetal heart Fetal Ultrasounds or other Referrals:  Referred to Materal Fetal Medicine  Maternal Substance Abuse:  No Significant Maternal Medications:  None Significant Maternal Lab Results: None  Results for orders placed or performed in visit on 09/06/16 (from the past 24 hour(s))  POCT urinalysis dipstick   Collection Time: 09/06/16  1:25 PM  Result Value Ref Range   Color, UA     Clarity, UA     Glucose, UA neg  Bilirubin, UA     Ketones, UA neg    Spec Grav, UA  1.010 - 1.025   Blood, UA neg    pH, UA  5.0 - 8.0   Protein, UA 1    Urobilinogen, UA  0.2 or 1.0 E.U./dL   Nitrite, UA neg    Leukocytes, UA Negative Negative    Patient Active Problem List   Diagnosis Date Noted  . Oligohydramnios antepartum 09/06/2016  . Normal labor 09/06/2016  . Abnormal Pap smear of cervix 05/15/2016  . Supervision of normal pregnancy 05/09/2016  . Late prenatal care 05/09/2016  . Hx of preeclampsia, prior pregnancy, currently pregnant 05/09/2016  . Antenatal screening for malformation  using ultrasonics 04/24/2016  . Menometrorrhagia 08/29/2014  . History of chlamydia 11/07/2012  . Rh negative, antepartum 11/07/2012  . 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 PATELLO-FEMORAL SYNDROME 06/01/2008    Assessment: Jennifer Aguilar is a 21 y.o. G3P2002 at [redacted]w[redacted]d here for IOL for oligo, AFI 2.1.   #Labor: IOL  #Pain: desires epidural  #FWB: Cat 1 tracing #ID:  GBS positive will treat w/ PCN #MOF: breast and bottle #MOC: IUD #Circ:  no  Tillman Sers, DO PGY-2 8/24/20188:59 PM   I confirm that I have verified the information documented in the resident's note and that I have also personally reperformed the physical exam and all medical decision making activities. Luna Kitchens CNM

## 2016-09-07 ENCOUNTER — Encounter (HOSPITAL_COMMUNITY): Payer: Self-pay | Admitting: Obstetrics and Gynecology

## 2016-09-07 DIAGNOSIS — O99824 Streptococcus B carrier state complicating childbirth: Secondary | ICD-10-CM

## 2016-09-07 DIAGNOSIS — O4103X0 Oligohydramnios, third trimester, not applicable or unspecified: Secondary | ICD-10-CM

## 2016-09-07 DIAGNOSIS — Z3A40 40 weeks gestation of pregnancy: Secondary | ICD-10-CM

## 2016-09-07 LAB — RPR: RPR: NONREACTIVE

## 2016-09-07 MED ORDER — ONDANSETRON HCL 4 MG/2ML IJ SOLN: 4.0000 mg | INTRAMUSCULAR | Status: DC | PRN

## 2016-09-07 MED ORDER — SENNOSIDES-DOCUSATE SODIUM 8.6-50 MG PO TABS
2.0000 | ORAL_TABLET | ORAL | Status: DC
  Administered 2016-09-07: 2 via ORAL
  Filled 2016-09-07: qty 2

## 2016-09-07 MED ORDER — BENZOCAINE-MENTHOL 20-0.5 % EX AERO
1.0000 "application " | INHALATION_SPRAY | CUTANEOUS | Status: DC | PRN
  Filled 2016-09-07: qty 56

## 2016-09-07 MED ORDER — MISOPROSTOL 200 MCG PO TABS
800.0000 ug | ORAL_TABLET | Freq: Once | ORAL | Status: AC
  Administered 2016-09-07: 800 ug via RECTAL

## 2016-09-07 MED ORDER — SIMETHICONE 80 MG PO CHEW: 80.0000 mg | CHEWABLE_TABLET | ORAL | Status: DC | PRN

## 2016-09-07 MED ORDER — MISOPROSTOL 200 MCG PO TABS
ORAL_TABLET | ORAL | Status: AC
  Filled 2016-09-07: qty 4

## 2016-09-07 MED ORDER — PENICILLIN G POTASSIUM 5000000 UNITS IJ SOLR
5.0000 10*6.[IU] | Freq: Once | INTRAVENOUS | Status: AC
  Administered 2016-09-07: 5 10*6.[IU] via INTRAVENOUS
  Filled 2016-09-07: qty 5

## 2016-09-07 MED ORDER — PENICILLIN G POTASSIUM 5000000 UNITS IJ SOLR
2.5000 10*6.[IU] | INTRAVENOUS | Status: DC
  Filled 2016-09-07: qty 2.5

## 2016-09-07 MED ORDER — PENICILLIN G POT IN DEXTROSE 60000 UNIT/ML IV SOLN
3.0000 10*6.[IU] | INTRAVENOUS | Status: DC
  Filled 2016-09-07 (×4): qty 50

## 2016-09-07 MED ORDER — TETANUS-DIPHTH-ACELL PERTUSSIS 5-2.5-18.5 LF-MCG/0.5 IM SUSP: 0.5000 mL | Freq: Once | INTRAMUSCULAR | Status: DC

## 2016-09-07 MED ORDER — ZOLPIDEM TARTRATE 5 MG PO TABS: 5.0000 mg | ORAL_TABLET | Freq: Every evening | ORAL | Status: DC | PRN

## 2016-09-07 MED ORDER — ACETAMINOPHEN 325 MG PO TABS: 650.0000 mg | ORAL_TABLET | ORAL | Status: DC | PRN

## 2016-09-07 MED ORDER — IBUPROFEN 600 MG PO TABS
600.0000 mg | ORAL_TABLET | Freq: Four times a day (QID) | ORAL | Status: DC
  Administered 2016-09-07 – 2016-09-08 (×6): 600 mg via ORAL
  Filled 2016-09-07 (×6): qty 1

## 2016-09-07 MED ORDER — WITCH HAZEL-GLYCERIN EX PADS: 1.0000 "application " | MEDICATED_PAD | CUTANEOUS | Status: DC | PRN

## 2016-09-07 MED ORDER — PRENATAL MULTIVITAMIN CH
1.0000 | ORAL_TABLET | Freq: Every day | ORAL | Status: DC
  Administered 2016-09-08: 1 via ORAL
  Filled 2016-09-07: qty 1

## 2016-09-07 MED ORDER — DIPHENHYDRAMINE HCL 25 MG PO CAPS: 25.0000 mg | ORAL_CAPSULE | Freq: Four times a day (QID) | ORAL | Status: DC | PRN

## 2016-09-07 MED ORDER — COCONUT OIL OIL: 1.0000 "application " | TOPICAL_OIL | Status: DC | PRN

## 2016-09-07 MED ORDER — ONDANSETRON HCL 4 MG PO TABS: 4.0000 mg | ORAL_TABLET | ORAL | Status: DC | PRN

## 2016-09-07 MED ORDER — DIBUCAINE 1 % RE OINT: 1.0000 "application " | TOPICAL_OINTMENT | RECTAL | Status: DC | PRN

## 2016-09-07 NOTE — Progress Notes (Signed)
   Jennifer Aguilar is a 21 y.o. G3P2002 at [redacted]w[redacted]d  admitted for induction of labor due to oligo (afi of 2.2).  Subjective: PAtient resting well; tolerating contractions well.   Objective: Vitals:   09/07/16 0200 09/07/16 0230 09/07/16 0300 09/07/16 0330  BP: 126/79 135/75 128/74 122/78  Pulse: 79 78 81 77  Resp:      Temp:      TempSrc:      Weight:      Height:       No intake/output data recorded.  FHT:  FHR: 140 bpm, variability: moderate,  accelerations:  Present,  decelerations:  Absent UC:   irregular, every 2-4 minutes SVE:   Dilation: 4 Effacement (%): 70 Station: -1 Exam by:: Kooistra, CNM Pitocin @ 2 mu/min  Labs: Lab Results  Component Value Date   WBC 10.9 (H) 09/06/2016   HGB 12.8 09/06/2016   HCT 38.6 09/06/2016   MCV 89.1 09/06/2016   PLT 221 09/06/2016    Assessment / Plan: Spontaneous labor, progressing normally  Labor: Progressing on Pitocin, will continue to increase then AROM Fetal Wellbeing:  Category I Pain Control:  Labor support without medications Anticipated MOD:  NSVD  Marylene Land CNM 09/07/2016, 5:57 AM

## 2016-09-08 LAB — CBC
HEMATOCRIT: 33.8 % — AB (ref 36.0–46.0)
Hemoglobin: 11.3 g/dL — ABNORMAL LOW (ref 12.0–15.0)
MCH: 30.3 pg (ref 26.0–34.0)
MCHC: 33.4 g/dL (ref 30.0–36.0)
MCV: 90.6 fL (ref 78.0–100.0)
PLATELETS: 178 10*3/uL (ref 150–400)
RBC: 3.73 MIL/uL — ABNORMAL LOW (ref 3.87–5.11)
RDW: 13.9 % (ref 11.5–15.5)
WBC: 10.6 10*3/uL — AB (ref 4.0–10.5)

## 2016-09-08 NOTE — Discharge Summary (Signed)
Obstetric Discharge Summary Reason for Admission: induction of labor Prenatal Procedures: none Intrapartum Procedures: spontaneous vaginal delivery Postpartum Procedures: Rho(D) Ig Complications-Operative and Postpartum: none Hemoglobin  Date Value Ref Range Status  09/08/2016 11.3 (L) 12.0 - 15.0 g/dL Final  34/28/7681 15.7 11.1 - 15.9 g/dL Final   HCT  Date Value Ref Range Status  09/08/2016 33.8 (L) 36.0 - 46.0 % Final   Hematocrit  Date Value Ref Range Status  05/09/2016 40.1 34.0 - 46.6 % Final    Physical Exam:  General: alert, cooperative and no distress Lochia: appropriate Uterine Fundus: firm Incision: n/a DVT Evaluation: No evidence of DVT seen on physical exam.  Discharge Diagnoses: Term Pregnancy-delivered  Discharge Information: Date: 09/08/2016 Activity: pelvic rest Diet: routine Medications: None Condition: stable Instructions: refer to practice specific booklet Discharge to: home Follow-up Information    [facility]. Schedule an appointment as soon as possible for a visit in 6 week(s).   Contact information: [address] [place] [address] [phone]          Newborn Data: Live born female  Birth Weight: 7 lb 2.5 oz (3245 g) APGAR: 9, 9  Home with mother  Breast feeding Desires IUD post-partum.  Tillman Sers 09/08/2016, 6:51 AM

## 2016-09-08 NOTE — Discharge Instructions (Signed)

## 2016-10-08 ENCOUNTER — Ambulatory Visit (INDEPENDENT_AMBULATORY_CARE_PROVIDER_SITE_OTHER): Payer: Medicaid Other | Admitting: Advanced Practice Midwife

## 2016-10-08 ENCOUNTER — Encounter: Payer: Self-pay | Admitting: Advanced Practice Midwife

## 2016-10-08 NOTE — Progress Notes (Signed)
Jennifer Aguilar is a 21 y.o. who presents for a postpartum visit. She is 4 weeks postpartum following a spontaneous vaginal delivery. I have fully reviewed the prenatal and intrapartum course. The delivery was at 40.4 gestational weeks.  She had IOL d/t ologohydramnios.  Also, fetal heart looked sl enlarged on [place] at 40.3 weeks.  Was not enlarged     . Anesthesia: none. Postpartum course has been unevnetful. Baby's course has been uneventful. Baby is feeding by bottle. Bleeding: no bleeding. Bowel function is normal. Bladder function is normal. Patient is not sexually active. Contraception method is none. Postpartum depression screening: negative.  No current outpatient prescriptions on file.  Review of Systems   Constitutional: Negative for fever and chills Eyes: Negative for visual disturbances Respiratory: Negative for shortness of breath, dyspnea Cardiovascular: Negative for chest pain or palpitations  Gastrointestinal: Negative for vomiting, diarrhea and constipation Genitourinary: Negative for dysuria and urgency Musculoskeletal: Negative for back pain, joint pain, myalgias  Neurological: Negative for dizziness and headaches    Objective:     Vitals:   10/08/16 1422  BP: 136/78  Pulse: 94   General:  alert, cooperative and no distress   Breasts:  negative  Lungs: clear to auscultation bilaterally  Heart:  regular rate and rhythm  Abdomen: Soft, nontender   Vulva:  normal  Vagina: normal vagina  Cervix:  closed  Corpus: Well involuted     Rectal Exam: no hemorrhoids        Assessment:    normal postpartum exam.  Plan:   1. Contraception: IUD 2. Follow up in: 2 weeks for Mirena; no sex until then

## 2016-10-22 ENCOUNTER — Ambulatory Visit (INDEPENDENT_AMBULATORY_CARE_PROVIDER_SITE_OTHER): Payer: Medicaid Other | Admitting: Advanced Practice Midwife

## 2016-10-22 ENCOUNTER — Encounter: Payer: Self-pay | Admitting: Advanced Practice Midwife

## 2016-10-22 VITALS — BP 110/80 | HR 84 | Wt 177.0 lb

## 2016-10-22 DIAGNOSIS — Z3202 Encounter for pregnancy test, result negative: Secondary | ICD-10-CM | POA: Diagnosis not present

## 2016-10-22 DIAGNOSIS — Z30430 Encounter for insertion of intrauterine contraceptive device: Secondary | ICD-10-CM

## 2016-10-22 DIAGNOSIS — Z3049 Encounter for surveillance of other contraceptives: Secondary | ICD-10-CM | POA: Diagnosis not present

## 2016-10-22 LAB — POCT URINE PREGNANCY: Preg Test, Ur: NEGATIVE

## 2016-10-22 MED ORDER — LEVONORGESTREL 20 MCG/24HR IU IUD
INTRAUTERINE_SYSTEM | Freq: Once | INTRAUTERINE | Status: AC
  Administered 2016-10-22: 15:00:00 via INTRAUTERINE

## 2016-10-22 NOTE — Patient Instructions (Signed)

## 2016-10-22 NOTE — Progress Notes (Signed)
Jennifer Aguilar is a 22 y.o. year old  female   who presents for placement of a Mirena IUD. Her LMP was postpartum and her pregnancy test today is negative.    The risks and benefits of the method and placement have been thouroughly reviewed with the patient and all questions were answered.  Specifically the patient is aware of failure rate of 01/998, expulsion of the IUD and of possible perforation.  The patient is aware of irregular bleeding due to the method and understands the incidence of irregular bleeding diminishes with time.  Time out was performed.  A Graves speculum was placed.  The cervix was prepped using Betadine. The uterus was found to be neutral and it sounded to 8 cm.  The cervix was grasped with a tenaculum and the IUD was inserted to 8 cm.  It was pulled back 1 cm and the IUD was disengaged.  The strings were trimmed to 3 cm.  Sonogram was performed and the proper placement of the IUD was verified.  The patient was instructed on signs and symptoms of infection and to check for the strings after each menses or each month.  The patient is to refrain from intercourse for 3 days.  The patient is scheduled for a return appointment after her first menses or 4 weeks.  CRESENZO-DISHMAN,Haydn Hutsell 10/22/2016 2:05 PM

## 2016-10-22 NOTE — Addendum Note (Signed)
Addended by: Federico Flake A on: 10/22/2016 02:36 PM   Modules accepted: Orders

## 2016-11-20 ENCOUNTER — Ambulatory Visit: Payer: Medicaid Other | Admitting: Advanced Practice Midwife

## 2016-11-28 ENCOUNTER — Ambulatory Visit: Payer: Medicaid Other | Admitting: Advanced Practice Midwife

## 2016-12-11 ENCOUNTER — Ambulatory Visit: Payer: Medicaid Other | Admitting: Advanced Practice Midwife

## 2016-12-17 ENCOUNTER — Ambulatory Visit: Payer: Medicaid Other | Admitting: Advanced Practice Midwife

## 2016-12-19 ENCOUNTER — Encounter: Payer: Self-pay | Admitting: Advanced Practice Midwife

## 2016-12-19 ENCOUNTER — Ambulatory Visit (INDEPENDENT_AMBULATORY_CARE_PROVIDER_SITE_OTHER): Payer: Medicaid Other | Admitting: Advanced Practice Midwife

## 2016-12-19 VITALS — BP 120/80 | HR 92 | Wt 176.0 lb

## 2016-12-19 DIAGNOSIS — Z30431 Encounter for routine checking of intrauterine contraceptive device: Secondary | ICD-10-CM

## 2016-12-19 NOTE — Progress Notes (Signed)
History:  21 y.o. [id number] here today for today for IUD string check; Mirena IUD was placed  10/9. No complaints about the Mirena, no concerning side effects.  The following portions of the patient's history were reviewed and updated as appropriate: allergies, current medications, past family history, past medical history, past social history, past surgical history and problem list.  Review of Systems:   Constitutional: Negative for fever and chills Eyes: Negative for visual disturbances Respiratory: Negative for shortness of breath, dyspnea Cardiovascular: Negative for chest pain or palpitations  Gastrointestinal: Negative for vomiting, diarrhea and constipation Genitourinary: Negative for dysuria and urgency Musculoskeletal: Negative for back pain, joint pain, myalgias  Neurological: Negative for dizziness and headaches    Objective:  Physical Exam Blood pressure 120/80, pulse 92, weight 176 lb (79.8 kg), last menstrual period 12/10/2016, not currently breastfeeding. Gen: NAD Abd: Soft, nontender and nondistended Pelvic: Bedside US reveals properly place IUD.   Assessment & Plan:  Normal IUD check. Patient to keep IUD in place for five years; can come in for removal if she desires pregnancy within the next five years. Routine preventative health maintenance measures emphasized.

## 2017-02-06 ENCOUNTER — Other Ambulatory Visit: Payer: Self-pay | Admitting: Family Medicine

## 2017-02-06 MED ORDER — PREDNISONE 20 MG PO TABS: ORAL_TABLET | ORAL | 0 refills | Status: DC

## 2017-02-06 MED ORDER — TRIAMCINOLONE ACETONIDE 0.1 % EX CREA: 1.0000 "application " | TOPICAL_CREAM | Freq: Two times a day (BID) | CUTANEOUS | 4 refills | Status: DC

## 2017-02-20 ENCOUNTER — Telehealth: Payer: Self-pay | Admitting: Women's Health

## 2017-02-20 MED ORDER — MEGESTROL ACETATE 40 MG PO TABS: ORAL_TABLET | ORAL | 1 refills | Status: DC

## 2017-02-20 NOTE — Telephone Encounter (Signed)
Informed pt that rx for megace had been sent to her pharmacy.

## 2017-02-20 NOTE — Telephone Encounter (Signed)
Pt states that she is having heavy periods with her IUD. She states they are lasting about 4 days. Pt states that Drenda FreezeFran told her that if this continued to be an issue she could call and a medication could be prescribed for this. Informed pt that I would send her request to a provider and she should check with her pharmacy in the next 24 hours. Pt verbalized understanding.

## 2017-07-15 ENCOUNTER — Telehealth: Payer: Self-pay | Admitting: Family Medicine

## 2017-07-15 NOTE — Telephone Encounter (Signed)
She may have 1 refill but it is not healthy for her to be on this on a regular basis I recommend a dermatology referral

## 2017-07-15 NOTE — Telephone Encounter (Signed)
Pt is requesting a refill on predniSONE (DELTASONE) 20 MG tablet for her eczema. [id number] - [place], [place] - [address]

## 2017-07-16 ENCOUNTER — Telehealth: Payer: Self-pay | Admitting: Family Medicine

## 2017-07-16 ENCOUNTER — Other Ambulatory Visit: Payer: Self-pay | Admitting: Family Medicine

## 2017-07-16 DIAGNOSIS — L309 Dermatitis, unspecified: Secondary | ICD-10-CM

## 2017-07-16 MED ORDER — PREDNISONE 20 MG PO TABS: ORAL_TABLET | ORAL | 0 refills | Status: DC

## 2017-07-16 NOTE — Telephone Encounter (Signed)
It would be okay to use the 10 mg

## 2017-07-16 NOTE — Telephone Encounter (Signed)
[facility] to see if it would be ok to give the pt 10 mg and instead of the 20 mg.

## 2017-07-16 NOTE — Telephone Encounter (Signed)
I called and left a message,asked that she r/c. 

## 2017-07-16 NOTE — Telephone Encounter (Signed)
Left message on patients voicemail to let her know

## 2017-07-16 NOTE — Telephone Encounter (Signed)
Contact pharmacy and informed pharmacist that the 10mg  would be OK.

## 2017-07-16 NOTE — Telephone Encounter (Signed)
Pt returned call. Pt informed that this was not healthy to be on this long term. Pt verbalized understanding. Will put in referral to dermatology

## 2017-07-16 NOTE — Telephone Encounter (Signed)
Med sent to pharmacy.

## 2017-07-18 NOTE — Telephone Encounter (Signed)
Contacted patient. Pt verbalized that she picked up the medication.

## 2017-07-24 ENCOUNTER — Encounter: Payer: Self-pay | Admitting: Family Medicine

## 2017-07-31 ENCOUNTER — Encounter: Payer: Self-pay | Admitting: Family Medicine

## 2017-11-26 ENCOUNTER — Other Ambulatory Visit: Payer: Self-pay | Admitting: Family Medicine

## 2018-05-19 ENCOUNTER — Telehealth: Payer: Self-pay | Admitting: Women's Health

## 2018-05-19 NOTE — Telephone Encounter (Signed)
LMOVM returning patient's call.  

## 2018-05-19 NOTE — Telephone Encounter (Signed)
Unable to hear patient. Will try again later.

## 2018-05-19 NOTE — Telephone Encounter (Signed)
Pt calling to see if she can have medication to help slow her menstrual flow.

## 2018-05-21 ENCOUNTER — Telehealth: Payer: Self-pay | Admitting: *Deleted

## 2018-05-21 MED ORDER — MEGESTROL ACETATE 40 MG PO TABS: ORAL_TABLET | ORAL | 1 refills | Status: DC

## 2018-05-21 NOTE — Addendum Note (Signed)
Addended by: Cyril Mourning A on: 05/21/2018 12:39 PM   Modules accepted: Orders

## 2018-05-21 NOTE — Telephone Encounter (Signed)
Refilled megace  

## 2018-05-21 NOTE — Telephone Encounter (Signed)
Patient states she is having some irregular bleeding with the IUD.  She has had this issue in the past and was prescribed Megace but is requesting a refill.  Please advise.  Advised to check back with her pharmacy later today if she did not hear back from [place].

## 2018-07-22 ENCOUNTER — Telehealth: Payer: Self-pay | Admitting: *Deleted

## 2018-07-22 NOTE — Telephone Encounter (Signed)
Patient called wanting to schedule an appointment to have her birth control removed [phone]

## 2018-07-22 NOTE — Telephone Encounter (Signed)
Pt states she is bleeding bad with IUD. I advised you usually don't have bleeding with IUD and we need to figure out why she is bleeding. Call transferred to front desk for appt to discuss bleeding but not necessarily to have IUD removed. Pt voiced understanding. [place]

## 2018-07-27 ENCOUNTER — Telehealth: Payer: Self-pay | Admitting: Women's Health

## 2018-07-27 NOTE — Telephone Encounter (Signed)

## 2018-07-28 ENCOUNTER — Ambulatory Visit (HOSPITAL_COMMUNITY)
Admission: RE | Admit: 2018-07-28 | Discharge: 2018-07-28 | Disposition: A | Discharge status: Home / Self Care | Payer: Medicaid Other | Source: Ambulatory Visit | Attending: Women's Health | Admitting: Women's Health

## 2018-07-28 ENCOUNTER — Encounter: Payer: Self-pay | Admitting: Women's Health

## 2018-07-28 ENCOUNTER — Telehealth: Payer: Self-pay | Admitting: Women's Health

## 2018-07-28 ENCOUNTER — Other Ambulatory Visit: Payer: Self-pay

## 2018-07-28 ENCOUNTER — Ambulatory Visit (INDEPENDENT_AMBULATORY_CARE_PROVIDER_SITE_OTHER): Payer: Medicaid Other | Admitting: Women's Health

## 2018-07-28 VITALS — BP 127/83 | HR 100 | Ht 65.0 in | Wt 192.4 lb

## 2018-07-28 DIAGNOSIS — T8332XA Displacement of intrauterine contraceptive device, initial encounter: Secondary | ICD-10-CM

## 2018-07-28 DIAGNOSIS — N920 Excessive and frequent menstruation with regular cycle: Secondary | ICD-10-CM | POA: Diagnosis not present

## 2018-07-28 DIAGNOSIS — Z113 Encounter for screening for infections with a predominantly sexual mode of transmission: Secondary | ICD-10-CM | POA: Diagnosis not present

## 2018-07-28 NOTE — Telephone Encounter (Signed)
Notified [name] that xray shows IUD outside of uterus, in LLQ. Discussed w/ JVF, will need exp lap to remove. He will get set up. If pt doesn't hear something by tomorrow afternoon, call us back at the office. No sex.  Jennifer Aguilar, CNM, [facility] 07/28/2018 2:29 PM

## 2018-07-28 NOTE — Progress Notes (Signed)
   GYN VISIT Patient name: Jennifer Aguilar MRN [id number]  Date of birth: 08-21-1995 Chief Complaint:   Menorrhagia (has iud/ taking megace, just come right back)  History of Present Illness:   Jennifer Aguilar is a 23 y.o. [id number] Caucasian female being seen today for report of heavy periods, lasting 7-14d that began few months after Mirena IUD placed 10/22/16. Megace helps, then bleeding returns. No clots, +cramps. At heaviest changes pad q 5-36mins. Hasn't felt strings.   No LMP recorded. (Menstrual status: IUD). The current method of family planning is IUD. Last pap 05/09/16. Results were:  ASCUS w/ -HRHPV Review of Systems:   Pertinent items are noted in HPI Denies fever/chills, dizziness, headaches, visual disturbances, fatigue, shortness of breath, chest pain, abdominal pain, vomiting, abnormal vaginal discharge/itching/odor/irritation, problems with periods, bowel movements, urination, or intercourse unless otherwise stated above.  Pertinent History Reviewed:  Reviewed past medical,surgical, social, obstetrical and family history.  Reviewed problem list, medications and allergies. Physical Assessment:   Vitals:   07/28/18 1059  BP: 127/83  Pulse: 100  Weight: 192 lb 6.4 oz (87.3 kg)  Height: 5\' 5"  (1.651 m)  Body mass index is 32.02 kg/m.       Physical Examination:   General appearance: alert, well appearing, and in no distress  Mental status: alert, oriented to person, place, and time  Skin: warm & dry   Cardiovascular: normal heart rate noted  Respiratory: normal respiratory effort, no distress  Abdomen: soft, non-tender   Pelvic: VULVA: normal appearing vulva with no masses, tenderness or lesions, VAGINA: normal appearing vagina with normal color and discharge, no lesions, CERVIX: normal appearing cervix without discharge or lesions, IUD strings NOT visible  Extremities: no edema   Informal transvaginal & transabdominal u/s: IUD not seen, confirmed by [employer], ultrasonographer   No results found for this or any previous visit (from the past 24 hour(s)).  Assessment & Plan:  1) Menorrhagia w/ regular periods> CBC, TSH, nuswab plus  2) IUD missing> likely expulsion, will get abd xray to make sure not extrauterine  3) STD screen  4) H/O abnormal pap> past due for f/u  Meds: No orders of the defined types were placed in this encounter.   Orders Placed This Encounter  Procedures  . DG Abd 1 View  . CBC  . TSH  . NuSwab Vaginitis Plus (VG+)    Return for 1st available , Pap & physical.  Roma Schanz CNM, WHNP-BC 07/28/2018 11:59 AM

## 2018-07-29 LAB — CBC
Hematocrit: 43.1 % (ref 34.0–46.6)
Hemoglobin: 14.4 g/dL (ref 11.1–15.9)
MCH: 29.1 pg (ref 26.6–33.0)
MCHC: 33.4 g/dL (ref 31.5–35.7)
MCV: 87 fL (ref 79–97)
Platelets: 361 10*3/uL (ref 150–450)
RBC: 4.94 x10E6/uL (ref 3.77–5.28)
RDW: 13.2 % (ref 11.7–15.4)
WBC: 7.6 10*3/uL (ref 3.4–10.8)

## 2018-07-29 LAB — TSH: TSH: 1 u[IU]/mL (ref 0.450–4.500)

## 2018-08-03 LAB — NUSWAB VAGINITIS PLUS (VG+)
Candida albicans, NAA: NEGATIVE
Candida glabrata, NAA: NEGATIVE
Chlamydia trachomatis, NAA: NEGATIVE
Neisseria gonorrhoeae, NAA: NEGATIVE
Trich vag by NAA: NEGATIVE

## 2018-08-14 ENCOUNTER — Encounter: Payer: Medicaid Other | Admitting: Obstetrics and Gynecology

## 2018-08-24 ENCOUNTER — Encounter: Payer: Self-pay | Admitting: Obstetrics and Gynecology

## 2018-08-24 ENCOUNTER — Other Ambulatory Visit: Payer: Self-pay

## 2018-08-24 ENCOUNTER — Ambulatory Visit (INDEPENDENT_AMBULATORY_CARE_PROVIDER_SITE_OTHER): Payer: Medicaid Other | Admitting: Obstetrics and Gynecology

## 2018-08-24 VITALS — BP 133/78 | HR 92 | Ht 65.0 in | Wt 195.4 lb

## 2018-08-24 DIAGNOSIS — Z30430 Encounter for insertion of intrauterine contraceptive device: Secondary | ICD-10-CM | POA: Diagnosis not present

## 2018-08-24 DIAGNOSIS — T839XXD Unspecified complication of genitourinary prosthetic device, implant and graft, subsequent encounter: Secondary | ICD-10-CM

## 2018-08-24 NOTE — Progress Notes (Addendum)
Patient ID: Jennifer Aguilar, female   DOB: October 21, 1995, 23 y.o.   MRN: [id number]  Preoperative History and Physical  Jennifer Aguilar is a 23 y.o. [phone] here for surgical management of intra abdominal IUD. No significant preoperative concerns. Has had IUD in place for about 1.5 year. Never stopped having period, they only got heavier. LMP was about a week ago. Is not using condoms but has not been sexually since her last her period..  Future contraception plans are oral contraceptives which she is taken in the past and did well.  Proposed surgery: diagnostic laparoscopy w/ removal of intra abdominal IUD  Past Medical History:  Diagnosis Date  . Menometrorrhagia 08/29/2014  . Seasonal allergies    Past Surgical History:  Procedure Laterality Date  . NO PAST SURGERIES     OB History  Gravida Para Term Preterm AB Living  3 3 3     3   SAB TAB Ectopic Multiple Live Births        0 3    # Outcome Date GA Lbr Len/2nd Weight Sex Delivery Anes PTL Lv  3 Term 09/07/16 [redacted]w[redacted]d / 00:05 7 lb 2.5 oz (3.245 kg) M Vag-Spont None  LIV  2 Term 06/24/14 [redacted]w[redacted]d 00:38 / 00:02 6 lb 6.5 oz (2.905 kg) M Vag-Spont None N LIV  1 Term 05/15/13 [redacted]w[redacted]d / 00:08 5 lb 15.6 oz (2.71 kg) F Vag-Spont None N LIV     Complications: Gestational hypertension  Patient denies any other pertinent gynecologic issues.   Current Outpatient Medications on File Prior to Visit  Medication Sig Dispense Refill  . levonorgestrel (MIRENA) 20 MCG/24HR IUD 1 each by Intrauterine route once.    . triamcinolone cream (KENALOG) 0.1 % APPLY  CREAM EXTERNALLY TO AFFECTED AREA TWICE DAILY 45 g 3   No current facility-administered medications on file prior to visit.    Allergies  Allergen Reactions  . Meloxicam     REACTION: increased heart rate, body numbness; tolerates ibuprofen, per pt    Social History:   reports that she has never smoked. She has never used smokeless tobacco. She reports that she does not drink alcohol or use  drugs.  Family History  Problem Relation Age of Onset  . Diabetes Mother   . Hypertension Mother   . Hypertension Father   . Heart failure Maternal Grandfather   . Hypertension Sister   . Hypertension Paternal Grandfather   . Heart failure Maternal Grandmother   . Diabetes Maternal Grandmother   . Cancer Paternal Aunt   . Hypertension Paternal Uncle     Review of Systems: Noncontributory no abdominal pain, no bowel or bladder abnormality.  PHYSICAL EXAM: Blood pressure 133/78, pulse 92, height 5\' 5"  (1.651 m), weight 195 lb 6.4 oz (88.6 kg), last menstrual period 08/17/2018, not currently breastfeeding. General appearance - alert, well appearing, and in no distress Chest - clear to auscultation, no wheezes, rales or rhonchi, symmetric air entry Heart - normal rate and regular rhythm Abdomen - soft, nontender, nondistended, no masses or organomegaly Pelvic - VAGINA: normal appearing vagina with CERVIX: normal appearing cervix  UTERUS: anteverted  Extremities - peripheral pulses normal, no pedal edema, no clubbing or cyanosis  Labs: No results found for this or any previous visit (from the past 336 hour(s)).  Imaging Studies: Dg Abd 1 View  Result Date: 07/28/2018 CLINICAL DATA:  Missing IUD EXAM: ABDOMEN - 1 VIEW COMPARISON:  None. FINDINGS: IUD is seen in the left lower quadrant the  abdomen overlying the left iliac wing. Several pelvic phleboliths also noted. The bowel gas pattern is normal. IMPRESSION: Abnormal IUD location in left lower quadrant, overlying the left iliac wing. Electronically Signed   By: Danae OrleansJohn A Stahl M.D.   On: 07/28/2018 17:17    Assessment: Patient Active Problem List   Diagnosis Date Noted  . Encounter for IUD insertion 10/22/2016  . Abnormal Pap smear of cervix 05/15/2016  . History of chlamydia 11/07/2012  . 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 PATELLO-FEMORAL SYNDROME 06/01/2008    Plan: Patient will undergo surgical management with  diagnostic laparoscopy w/ removal of  Intraabdominal IUD 09/01/18.  Plans for OCP until desire for pregnancy.   By signing my name below, I, Arnette NorrisMari Johnson, attest that this documentation has been prepared under the direction and in the presence of Tilda BurrowFerguson, Ladarion Munyon V, MD. Electronically Signed: Arnette NorrisMari Johnson Medical Scribe. 08/24/18. 2:43 PM.  I personally performed the services described in this documentation, which was SCRIBED in my presence. The recorded information has been reviewed and considered accurate. It has been edited as necessary during review. Tilda BurrowJohn V Kendalynn Wideman, MD

## 2018-08-24 NOTE — Patient Instructions (Signed)
Diagnostic Laparoscopy Diagnostic laparoscopy is a procedure to diagnose diseases in the abdomen. It might be done for a variety of reasons, such as to look for scar tissue, cancer, or a reason for abdomen (abdominal) pain. During the procedure, a thin, flexible tube that has a light and a camera on the end (laparoscope) is inserted through an incision in the abdomen. The image from the camera is shown on a monitor to help your surgeon see inside your body. Tell a health care provider about:  Any allergies you have.  All medicines you are taking, including vitamins, herbs, eye drops, creams, and over-the-counter medicines.  Any problems you or family members have had with anesthetic medicines.  Any blood disorders you have.  Any surgeries you have had.  Any medical conditions you have. What are the risks? Generally, this is a safe procedure. However, problems may occur, including:  Infection.  Bleeding.  Allergic reactions to medicines or dyes.  Damage to abdominal structures or organs, such as the intestines, liver, stomach, or spleen. What happens before the procedure? Medicines  Ask your health care provider about: ? Changing or stopping your regular medicines. This is especially important if you are taking diabetes medicines or blood thinners. ? Taking medicines such as aspirin and ibuprofen. These medicines can thin your blood. Do not take these medicines unless your health care provider tells you to take them. ? Taking over-the-counter medicines, vitamins, herbs, and supplements.  You may be given antibiotic medicine to help prevent infection. Staying hydrated Follow instructions from your health care provider about hydration, which may include:  Up to 2 hours before the procedure - you may continue to drink clear liquids, such as water, clear fruit juice, black coffee, and plain tea. Eating and drinking restrictions Follow instructions from your health care provider  about eating and drinking, which may include:  8 hours before the procedure - stop eating heavy meals or foods such as meat, fried foods, or fatty foods.  6 hours before the procedure - stop eating light meals or foods, such as toast or cereal.  6 hours before the procedure - stop drinking milk or drinks that contain milk.  2 hours before the procedure - stop drinking clear liquids. General instructions  Ask your health care provider how your surgical site will be marked or identified.  You may be asked to shower with a germ-killing soap.  Plan to have someone take you home from the hospital or clinic.  Plan to have a responsible adult care for you for at least 24 hours after you leave the hospital or clinic. This is important. What happens during the procedure?   To lower your risk of infection: ? Your health care team will wash or sanitize their hands. ? Hair may be removed from the surgical area. ? Your skin will be washed with soap.  An IV will be inserted into one of your veins.  You will be given a medicine to make you fall asleep (general anesthetic). You may also be given a medicine to help you relax (sedative).  A breathing tube will be placed down your throat to help you breathe during the procedure.  Your abdomen will be filled with an air-like gas so it expands. This will give the surgeon more room to operate and will make your organs easier to see.  Many small incisions will be made in your abdomen.  A laparoscope and other surgical instruments will be inserted into your abdomen through the   incisions.  A tissue sample may be removed from an organ for examination (biopsy). This will depend on the reason why you are having this procedure.  The laparoscope and other instruments will be removed from your abdomen.  The gas will be released.  Your incisions will be closed with stitches (sutures) and covered with a bandage (dressing).  Your breathing tube will be  removed. The procedure may vary among health care providers and hospitals. What happens after the procedure?   Your blood pressure, heart rate, breathing rate, and blood oxygen level will be monitored until the medicines you were given have worn off.  Do not drive for 24 hours if you were given a sedative during your procedure.  It is up to you to get the results of your procedure. Ask your health care provider, or the department that is doing the procedure, when your results will be ready. Summary  Diagnostic laparoscopy is a way to look for problems in the abdomen using small incisions.  Follow instructions from your health care provider about how to prepare for the procedure.  Plan to have a responsible adult care for you for at least 24 hours after you leave the hospital or clinic. This is important. This information is not intended to replace advice given to you by your health care provider. Make sure you discuss any questions you have with your health care provider. Document Released: 04/08/2000 Document Revised: 12/13/2016 Document Reviewed: 06/26/2016 [employer] Patient Education  2020 [employer]  

## 2018-08-26 NOTE — Patient Instructions (Signed)
Your procedure is scheduled on: 09/01/2018  Report to [facility] at   9:00  AM.  Call this number if you have problems the morning of surgery: [phone]   Remember:   Do not Eat or Drink after midnight   :  Take these medicines the morning of surgery with A SIP OF WATER: none   Do not wear jewelry, make-up or nail polish.  Do not wear lotions, powders, or perfumes. You may wear deodorant.  Do not shave 48 hours prior to surgery. Men may shave face and neck.  Do not bring valuables to the hospital.  Contacts, dentures or bridgework may not be worn into surgery.  Leave suitcase in the car. After surgery it may be brought to your room.  For patients admitted to the hospital, checkout time is 11:00 AM the day of discharge.   Patients discharged the day of surgery will not be allowed to drive home.    Special Instructions: Shower using CHG night before surgery and shower the day of surgery use CHG.  Use special wash - you have one bottle of CHG for all showers.  You should use approximately 1/2 of the bottle for each shower.  Diagnostic Laparoscopy Diagnostic laparoscopy is a procedure to diagnose diseases in the abdomen. It might be done for a variety of reasons, such as to look for scar tissue, cancer, or a reason for abdomen (abdominal) pain. During the procedure, a thin, flexible tube that has a light and a camera on the end (laparoscope) is inserted through an incision in the abdomen. The image from the camera is shown on a monitor to help your surgeon see inside your body. Tell a health care provider about:  Any allergies you have.  All medicines you are taking, including vitamins, herbs, eye drops, creams, and over-the-counter medicines.  Any problems you or family members have had with anesthetic medicines.  Any blood disorders you have.  Any surgeries you have had.  Any medical conditions you have. What are the risks? Generally, this is a safe procedure. However,  problems may occur, including:  Infection.  Bleeding.  Allergic reactions to medicines or dyes.  Damage to abdominal structures or organs, such as the intestines, liver, stomach, or spleen. What happens before the procedure? Medicines  Ask your health care provider about: ? Changing or stopping your regular medicines. This is especially important if you are taking diabetes medicines or blood thinners. ? Taking medicines such as aspirin and ibuprofen. These medicines can thin your blood. Do not take these medicines unless your health care provider tells you to take them. ? Taking over-the-counter medicines, vitamins, herbs, and supplements.  You may be given antibiotic medicine to help prevent infection. Staying hydrated Follow instructions from your health care provider about hydration, which may include:  Up to 2 hours before the procedure - you may continue to drink clear liquids, such as water, clear fruit juice, black coffee, and plain tea. Eating and drinking restrictions Follow instructions from your health care provider about eating and drinking, which may include:  8 hours before the procedure - stop eating heavy meals or foods such as meat, fried foods, or fatty foods.  6 hours before the procedure - stop eating light meals or foods, such as toast or cereal.  6 hours before the procedure - stop drinking milk or drinks that contain milk.  2 hours before the procedure - stop drinking clear liquids. General instructions  Ask your health care provider  how your surgical site will be marked or identified.  You may be asked to shower with a germ-killing soap.  Plan to have someone take you home from the hospital or clinic.  Plan to have a responsible adult care for you for at least 24 hours after you leave the hospital or clinic. This is important. What happens during the procedure?   To lower your risk of infection: ? Your health care team will wash or sanitize their  hands. ? Hair may be removed from the surgical area. ? Your skin will be washed with soap.  An IV will be inserted into one of your veins.  You will be given a medicine to make you fall asleep (general anesthetic). You may also be given a medicine to help you relax (sedative).  A breathing tube will be placed down your throat to help you breathe during the procedure.  Your abdomen will be filled with an air-like gas so it expands. This will give the surgeon more room to operate and will make your organs easier to see.  Many small incisions will be made in your abdomen.  A laparoscope and other surgical instruments will be inserted into your abdomen through the incisions.  A tissue sample may be removed from an organ for examination (biopsy). This will depend on the reason why you are having this procedure.  The laparoscope and other instruments will be removed from your abdomen.  The gas will be released.  Your incisions will be closed with stitches (sutures) and covered with a bandage (dressing).  Your breathing tube will be removed. The procedure may vary among health care providers and hospitals. What happens after the procedure?   Your blood pressure, heart rate, breathing rate, and blood oxygen level will be monitored until the medicines you were given have worn off.  Do not drive for 24 hours if you were given a sedative during your procedure.  It is up to you to get the results of your procedure. Ask your health care provider, or the department that is doing the procedure, when your results will be ready. Summary  Diagnostic laparoscopy is a way to look for problems in the abdomen using small incisions.  Follow instructions from your health care provider about how to prepare for the procedure.  Plan to have a responsible adult care for you for at least 24 hours after you leave the hospital or clinic. This is important. This information is not intended to replace advice  given to you by your health care provider. Make sure you discuss any questions you have with your health care provider. Document Released: 04/08/2000 Document Revised: 12/13/2016 Document Reviewed: 06/26/2016 Elsevier Patient Education  2020 [employer]  Diagnostic Laparoscopy, Care After This sheet gives you information about how to care for yourself after your procedure. Your health care provider may also give you more specific instructions. If you have problems or questions, contact your health care provider. What can I expect after the procedure? After the procedure, it is common to have:  Mild discomfort in the abdomen.  Sore throat. Women who have laparoscopy with pelvic examination may have mild cramping and fluid coming from the vagina for a few days after the procedure. Follow these instructions at home: Medicines  Take over-the-counter and prescription medicines only as told by your health care provider.  If you were prescribed an antibiotic medicine, take it as told by your health care provider. Do not stop taking the antibiotic even if  you start to feel better. Driving  Do not drive for 24 hours if you were given a medicine to help you relax (sedative) during your procedure.  Do not drive or use heavy machinery while taking prescription pain medicine. Bathing  Do not take baths, swim, or use a hot tub until your health care provider approves. You may take showers. Incision care   Follow instructions from your health care provider about how to take care of your incisions. Make sure you: ? Wash your hands with soap and water before you change your bandage (dressing). If soap and water are not available, use hand sanitizer. ? Change your dressing as told by your health care provider. ? Leave stitches (sutures), skin glue, or adhesive strips in place. These skin closures may need to stay in place for 2 weeks or longer. If adhesive strip edges start to loosen and curl up,  you may trim the loose edges. Do not remove adhesive strips completely unless your health care provider tells you to do that.  Check your incision areas every day for signs of infection. Check for: ? Redness, swelling, or pain. ? Fluid or blood. ? Warmth. ? Pus or a bad smell. Activity  Return to your normal activities as told by your health care provider. Ask your health care provider what activities are safe for you.  Do not lift anything that is heavier than 10 lb (4.5 kg), or the limit that you are told, until your health care provider says that it is safe. General instructions  To prevent or treat constipation while you are taking prescription pain medicine, your health care provider may recommend that you: ? Drink enough fluid to keep your urine pale yellow. ? Take over-the-counter or prescription medicines. ? Eat foods that are high in fiber, such as fresh fruits and vegetables, whole grains, and beans. ? Limit foods that are high in fat and processed sugars, such as fried and sweet foods.  Do not use any products that contain nicotine or tobacco, such as cigarettes and e-cigarettes. If you need help quitting, ask your health care provider.  Keep all follow-up visits as told by your health care provider. This is important. Contact a health care provider if:  You develop shoulder pain.  You feel lightheaded or faint.  You are unable to pass gas or have a bowel movement.  You feel nauseous or you vomit.  You develop a rash.  You have redness, swelling, or pain around any incision.  You have fluid or blood coming from any incision.  Any incision feels warm to the touch.  You have pus or a bad smell coming from any incision.  You have a fever or chills. Get help right away if:  You have severe pain.  You have vomiting that does not go away.  You have heavy bleeding from the vagina.  Any incision opens.  You have trouble breathing.  You have chest pain.  Summary  After the procedure, it is common to have mild discomfort in the abdomen and a sore throat.  Check your incision areas every day for signs of infection.  Return to your normal activities as told by your health care provider. Ask your health care provider what activities are safe for you. This information is not intended to replace advice given to you by your health care provider. Make sure you discuss any questions you have with your health care provider. Document Released: 12/12/2014 Document Revised: 12/13/2016 Document Reviewed: 06/26/2016 Elsevier  Patient Education  2020 [place] Anesthesia, Adult, Care After This sheet gives you information about how to care for yourself after your procedure. Your health care provider may also give you more specific instructions. If you have problems or questions, contact your health care provider. What can I expect after the procedure? After the procedure, the following side effects are common:  Pain or discomfort at the IV site.  Nausea.  Vomiting.  Sore throat.  Trouble concentrating.  Feeling cold or chills.  Weak or tired.  Sleepiness and fatigue.  Soreness and body aches. These side effects can affect parts of the body that were not involved in surgery. Follow these instructions at home:  For at least 24 hours after the procedure:  Have a responsible adult stay with you. It is important to have someone help care for you until you are awake and alert.  Rest as needed.  Do not: ? Participate in activities in which you could fall or become injured. ? Drive. ? Use heavy machinery. ? Drink alcohol. ? Take sleeping pills or medicines that cause drowsiness. ? Make important decisions or sign legal documents. ? Take care of children on your own. Eating and drinking  Follow any instructions from your health care provider about eating or drinking restrictions.  When you feel hungry, start by eating small  amounts of foods that are soft and easy to digest (bland), such as toast. Gradually return to your regular diet.  Drink enough fluid to keep your urine pale yellow.  If you vomit, rehydrate by drinking water, juice, or clear broth. General instructions  If you have sleep apnea, surgery and certain medicines can increase your risk for breathing problems. Follow instructions from your health care provider about wearing your sleep device: ? Anytime you are sleeping, including during daytime naps. ? While taking prescription pain medicines, sleeping medicines, or medicines that make you drowsy.  Return to your normal activities as told by your health care provider. Ask your health care provider what activities are safe for you.  Take over-the-counter and prescription medicines only as told by your health care provider.  If you smoke, do not smoke without supervision.  Keep all follow-up visits as told by your health care provider. This is important. Contact a health care provider if:  You have nausea or vomiting that does not get better with medicine.  You cannot eat or drink without vomiting.  You have pain that does not get better with medicine.  You are unable to pass urine.  You develop a skin rash.  You have a fever.  You have redness around your IV site that gets worse. Get help right away if:  You have difficulty breathing.  You have chest pain.  You have blood in your urine or stool, or you vomit blood. Summary  After the procedure, it is common to have a sore throat or nausea. It is also common to feel tired.  Have a responsible adult stay with you for the first 24 hours after general anesthesia. It is important to have someone help care for you until you are awake and alert.  When you feel hungry, start by eating small amounts of foods that are soft and easy to digest (bland), such as toast. Gradually return to your regular diet.  Drink enough fluid to keep your  urine pale yellow.  Return to your normal activities as told by your health care provider. Ask your health care provider what activities  are safe for you. This information is not intended to replace advice given to you by your health care provider. Make sure you discuss any questions you have with your health care provider. Document Released: 04/08/2000 Document Revised: 01/03/2017 Document Reviewed: 08/16/2016 Elsevier Patient Education  2020 [employer]

## 2018-08-28 ENCOUNTER — Other Ambulatory Visit: Payer: Self-pay

## 2018-08-28 ENCOUNTER — Encounter (HOSPITAL_COMMUNITY)
Admission: RE | Admit: 2018-08-28 | Discharge: 2018-08-28 | Disposition: A | Discharge status: Home / Self Care | Payer: Medicaid Other | Source: Ambulatory Visit | Attending: Obstetrics and Gynecology | Admitting: Obstetrics and Gynecology

## 2018-08-28 ENCOUNTER — Other Ambulatory Visit (HOSPITAL_COMMUNITY)
Admission: RE | Admit: 2018-08-28 | Discharge: 2018-08-28 | Disposition: A | Discharge status: Home / Self Care | Payer: Medicaid Other | Source: Ambulatory Visit | Attending: Obstetrics and Gynecology | Admitting: Obstetrics and Gynecology

## 2018-08-28 ENCOUNTER — Encounter (HOSPITAL_COMMUNITY): Payer: Self-pay

## 2018-08-28 DIAGNOSIS — Z20828 Contact with and (suspected) exposure to other viral communicable diseases: Secondary | ICD-10-CM | POA: Diagnosis not present

## 2018-08-28 DIAGNOSIS — Z975 Presence of (intrauterine) contraceptive device: Secondary | ICD-10-CM | POA: Diagnosis not present

## 2018-08-28 DIAGNOSIS — Z01812 Encounter for preprocedural laboratory examination: Secondary | ICD-10-CM | POA: Diagnosis present

## 2018-08-28 DIAGNOSIS — Z20822 Contact with and (suspected) exposure to covid-19: Secondary | ICD-10-CM

## 2018-08-28 LAB — URINALYSIS, ROUTINE W REFLEX MICROSCOPIC
Bilirubin Urine: NEGATIVE
Glucose, UA: NEGATIVE mg/dL
Hgb urine dipstick: NEGATIVE
Ketones, ur: NEGATIVE mg/dL
Leukocytes,Ua: NEGATIVE
Nitrite: NEGATIVE
Protein, ur: NEGATIVE mg/dL
Specific Gravity, Urine: 1.016 (ref 1.005–1.030)
pH: 9 — ABNORMAL HIGH (ref 5.0–8.0)

## 2018-08-28 LAB — HCG, SERUM, QUALITATIVE: Preg, Serum: NEGATIVE

## 2018-08-28 LAB — CBC
HCT: 42.1 % (ref 36.0–46.0)
Hemoglobin: 13.4 g/dL (ref 12.0–15.0)
MCH: 29.1 pg (ref 26.0–34.0)
MCHC: 31.8 g/dL (ref 30.0–36.0)
MCV: 91.5 fL (ref 80.0–100.0)
Platelets: 305 10*3/uL (ref 150–400)
RBC: 4.6 MIL/uL (ref 3.87–5.11)
RDW: 13.5 % (ref 11.5–15.5)
WBC: 6.7 10*3/uL (ref 4.0–10.5)
nRBC: 0 % (ref 0.0–0.2)

## 2018-08-28 LAB — COMPREHENSIVE METABOLIC PANEL
ALT: 21 U/L (ref 0–44)
AST: 17 U/L (ref 15–41)
Albumin: 4.1 g/dL (ref 3.5–5.0)
Alkaline Phosphatase: 83 U/L (ref 38–126)
Anion gap: 9 (ref 5–15)
BUN: 7 mg/dL (ref 6–20)
CO2: 22 mmol/L (ref 22–32)
Calcium: 9 mg/dL (ref 8.9–10.3)
Chloride: 108 mmol/L (ref 98–111)
Creatinine, Ser: 0.66 mg/dL (ref 0.44–1.00)
GFR calc Af Amer: >60 mL/min (ref >60)
GFR calc non Af Amer: >60 mL/min (ref >60)
Glucose, Bld: 83 mg/dL (ref 70–99)
Potassium: 3.9 mmol/L (ref 3.5–5.1)
Sodium: 139 mmol/L (ref 135–145)
Total Bilirubin: 0.5 mg/dL (ref 0.3–1.2)
Total Protein: 7.8 g/dL (ref 6.5–8.1)

## 2018-08-28 LAB — SARS CORONAVIRUS 2 (TAT 6-24 HRS): SARS Coronavirus 2: NEGATIVE

## 2018-09-01 ENCOUNTER — Encounter (HOSPITAL_COMMUNITY)
Admission: RE | Disposition: A | Discharge status: Home / Self Care | Payer: Self-pay | Source: Home / Self Care | Attending: Obstetrics and Gynecology

## 2018-09-01 ENCOUNTER — Ambulatory Visit (HOSPITAL_COMMUNITY): Payer: Medicaid Other | Admitting: Anesthesiology

## 2018-09-01 ENCOUNTER — Encounter (HOSPITAL_COMMUNITY): Payer: Self-pay | Admitting: Anesthesiology

## 2018-09-01 ENCOUNTER — Other Ambulatory Visit: Payer: Self-pay

## 2018-09-01 ENCOUNTER — Ambulatory Visit (HOSPITAL_COMMUNITY)
Admission: RE | Admit: 2018-09-01 | Discharge: 2018-09-01 | Disposition: A | Discharge status: Home / Self Care | Payer: Medicaid Other | Attending: Obstetrics and Gynecology | Admitting: Obstetrics and Gynecology

## 2018-09-01 DIAGNOSIS — Z30432 Encounter for removal of intrauterine contraceptive device: Secondary | ICD-10-CM | POA: Diagnosis not present

## 2018-09-01 DIAGNOSIS — T8332XA Displacement of intrauterine contraceptive device, initial encounter: Secondary | ICD-10-CM | POA: Insufficient documentation

## 2018-09-01 DIAGNOSIS — T839XXA Unspecified complication of genitourinary prosthetic device, implant and graft, initial encounter: Secondary | ICD-10-CM | POA: Diagnosis not present

## 2018-09-01 DIAGNOSIS — Y838 Other surgical procedures as the cause of abnormal reaction of the patient, or of later complication, without mention of misadventure at the time of the procedure: Secondary | ICD-10-CM | POA: Diagnosis not present

## 2018-09-01 HISTORY — PX: LAPAROSCOPY: SHX197

## 2018-09-01 SURGERY — LAPAROSCOPY, DIAGNOSTIC
Anesthesia: General | Site: Abdomen

## 2018-09-01 MED ORDER — LIDOCAINE 2% (20 MG/ML) 5 ML SYRINGE
INTRAMUSCULAR | Status: AC
  Filled 2018-09-01: qty 5

## 2018-09-01 MED ORDER — ONDANSETRON HCL 4 MG/2ML IJ SOLN
INTRAMUSCULAR | Status: DC | PRN
  Administered 2018-09-01: 4 mg via INTRAVENOUS

## 2018-09-01 MED ORDER — BUPIVACAINE HCL (PF) 0.5 % IJ SOLN
INTRAMUSCULAR | Status: DC | PRN
  Administered 2018-09-01: 9 mL

## 2018-09-01 MED ORDER — ACETAMINOPHEN-CODEINE #3 300-30 MG PO TABS: 2.0000 | ORAL_TABLET | ORAL | 0 refills | Status: DC | PRN

## 2018-09-01 MED ORDER — SUCCINYLCHOLINE CHLORIDE 20 MG/ML IJ SOLN
INTRAMUSCULAR | Status: DC | PRN
  Administered 2018-09-01: 100 mg via INTRAVENOUS

## 2018-09-01 MED ORDER — SUGAMMADEX SODIUM 200 MG/2ML IV SOLN
INTRAVENOUS | Status: DC | PRN
  Administered 2018-09-01: 200 mg via INTRAVENOUS

## 2018-09-01 MED ORDER — DEXAMETHASONE SODIUM PHOSPHATE 10 MG/ML IJ SOLN
INTRAMUSCULAR | Status: AC
  Filled 2018-09-01: qty 1

## 2018-09-01 MED ORDER — DEXAMETHASONE SODIUM PHOSPHATE 10 MG/ML IJ SOLN
INTRAMUSCULAR | Status: DC | PRN
  Administered 2018-09-01: 10 mg via INTRAVENOUS

## 2018-09-01 MED ORDER — BUPIVACAINE HCL (PF) 0.5 % IJ SOLN
INTRAMUSCULAR | Status: AC
  Filled 2018-09-01: qty 30

## 2018-09-01 MED ORDER — ONDANSETRON HCL 4 MG/2ML IJ SOLN
INTRAMUSCULAR | Status: AC
  Filled 2018-09-01: qty 2

## 2018-09-01 MED ORDER — FENTANYL CITRATE (PF) 100 MCG/2ML IJ SOLN
INTRAMUSCULAR | Status: DC | PRN
  Administered 2018-09-01: 50 ug via INTRAVENOUS
  Administered 2018-09-01: 100 ug via INTRAVENOUS

## 2018-09-01 MED ORDER — 0.9 % SODIUM CHLORIDE (POUR BTL) OPTIME
TOPICAL | Status: DC | PRN
  Administered 2018-09-01: 1000 mL

## 2018-09-01 MED ORDER — CEFAZOLIN SODIUM-DEXTROSE 2-4 GM/100ML-% IV SOLN
INTRAVENOUS | Status: AC
  Filled 2018-09-01: qty 100

## 2018-09-01 MED ORDER — CEFAZOLIN SODIUM-DEXTROSE 2-4 GM/100ML-% IV SOLN
2.0000 g | INTRAVENOUS | Status: AC
  Administered 2018-09-01: 09:00:00 2 g via INTRAVENOUS

## 2018-09-01 MED ORDER — PROMETHAZINE HCL 25 MG/ML IJ SOLN: 6.2500 mg | INTRAMUSCULAR | Status: DC | PRN

## 2018-09-01 MED ORDER — ROCURONIUM BROMIDE 10 MG/ML (PF) SYRINGE
PREFILLED_SYRINGE | INTRAVENOUS | Status: AC
  Filled 2018-09-01: qty 10

## 2018-09-01 MED ORDER — PROPOFOL 10 MG/ML IV BOLUS
INTRAVENOUS | Status: DC | PRN
  Administered 2018-09-01: 200 mg via INTRAVENOUS

## 2018-09-01 MED ORDER — FENTANYL CITRATE (PF) 100 MCG/2ML IJ SOLN
INTRAMUSCULAR | Status: AC
  Filled 2018-09-01: qty 2

## 2018-09-01 MED ORDER — HYDROMORPHONE HCL 1 MG/ML IJ SOLN: 0.2500 mg | INTRAMUSCULAR | Status: DC | PRN

## 2018-09-01 MED ORDER — MIDAZOLAM HCL 5 MG/5ML IJ SOLN
INTRAMUSCULAR | Status: DC | PRN
  Administered 2018-09-01: 2 mg via INTRAVENOUS

## 2018-09-01 MED ORDER — FENTANYL CITRATE (PF) 100 MCG/2ML IJ SOLN
INTRAMUSCULAR | Status: AC
  Filled 2018-09-01: qty 4

## 2018-09-01 MED ORDER — LACTATED RINGERS IV SOLN
INTRAVENOUS | Status: DC
  Administered 2018-09-01: 09:00:00 via INTRAVENOUS

## 2018-09-01 MED ORDER — SUCCINYLCHOLINE CHLORIDE 200 MG/10ML IV SOSY
PREFILLED_SYRINGE | INTRAVENOUS | Status: AC
  Filled 2018-09-01: qty 10

## 2018-09-01 MED ORDER — HYDROCODONE-ACETAMINOPHEN 7.5-325 MG PO TABS: 1.0000 | ORAL_TABLET | Freq: Once | ORAL | Status: DC | PRN

## 2018-09-01 MED ORDER — MIDAZOLAM HCL 2 MG/2ML IJ SOLN
INTRAMUSCULAR | Status: AC
  Filled 2018-09-01: qty 2

## 2018-09-01 MED ORDER — PROPOFOL 10 MG/ML IV BOLUS
INTRAVENOUS | Status: AC
  Filled 2018-09-01: qty 20

## 2018-09-01 MED ORDER — ROCURONIUM BROMIDE 100 MG/10ML IV SOLN
INTRAVENOUS | Status: DC | PRN
  Administered 2018-09-01: 20 mg via INTRAVENOUS
  Administered 2018-09-01: 5 mg via INTRAVENOUS

## 2018-09-01 MED ORDER — MIDAZOLAM HCL 2 MG/2ML IJ SOLN: 0.5000 mg | Freq: Once | INTRAMUSCULAR | Status: DC | PRN

## 2018-09-01 SURGICAL SUPPLY — 42 items
BANDAGE STRIP 1X3 FLEXIBLE (GAUZE/BANDAGES/DRESSINGS) ×4 IMPLANT
BLADE SURG SZ11 CARB STEEL (BLADE) ×2 IMPLANT
CATH ROBINSON RED A/P 14FR (CATHETERS) ×2 IMPLANT
CLOTH BEACON ORANGE TIMEOUT ST (SAFETY) ×2 IMPLANT
COVER LIGHT HANDLE STERIS (MISCELLANEOUS) ×4 IMPLANT
DURAPREP 26ML APPLICATOR (WOUND CARE) ×2 IMPLANT
ELECT REM PT RETURN 9FT ADLT (ELECTROSURGICAL) ×2
ELECTRODE REM PT RTRN 9FT ADLT (ELECTROSURGICAL) ×1 IMPLANT
GAUZE 4X4 16PLY RFD (DISPOSABLE) ×2 IMPLANT
GLOVE BIOGEL PI IND STRL 7.0 (GLOVE) ×1 IMPLANT
GLOVE BIOGEL PI IND STRL 9 (GLOVE) ×1 IMPLANT
GLOVE BIOGEL PI INDICATOR 7.0 (GLOVE) ×1
GLOVE BIOGEL PI INDICATOR 9 (GLOVE) ×1
GLOVE ECLIPSE 9.0 STRL (GLOVE) ×2 IMPLANT
GOWN SPEC L3 XXLG W/TWL (GOWN DISPOSABLE) ×4 IMPLANT
GOWN STRL REUS W/TWL LRG LVL3 (GOWN DISPOSABLE) ×2 IMPLANT
INST SET LAPROSCOPIC GYN AP (KITS) ×2 IMPLANT
IV STOPCOCK 4 WAY 40  W/Y SET (IV SOLUTION)
IV STOPCOCK 4 WAY 40 W/Y SET (IV SOLUTION) IMPLANT
KIT TURNOVER CYSTO (KITS) ×2 IMPLANT
MANIFOLD NEPTUNE II (INSTRUMENTS) ×2 IMPLANT
NEEDLE HYPO 18GX1.5 BLUNT FILL (NEEDLE) ×2 IMPLANT
NEEDLE HYPO 25X1 1.5 SAFETY (NEEDLE) ×2 IMPLANT
NEEDLE INSUFFLATION 14GA 120MM (NEEDLE) ×2 IMPLANT
PACK PERI GYN (CUSTOM PROCEDURE TRAY) ×2 IMPLANT
PAD ARMBOARD 7.5X6 YLW CONV (MISCELLANEOUS) ×2 IMPLANT
SET BASIN LINEN APH (SET/KITS/TRAYS/PACK) ×2 IMPLANT
SET TUBE IRRIG SUCTION NO TIP (IRRIGATION / IRRIGATOR) IMPLANT
SHEARS HARMONIC ACE PLUS 36CM (ENDOMECHANICALS) IMPLANT
SLEEVE ENDOPATH XCEL 5M (ENDOMECHANICALS) ×2 IMPLANT
SOLUTION ANTI FOG 6CC (MISCELLANEOUS) ×2 IMPLANT
STRIP CLOSURE SKIN 1/4X3 (GAUZE/BANDAGES/DRESSINGS) ×2 IMPLANT
SUT VIC AB 4-0 PS2 27 (SUTURE) ×2 IMPLANT
SUT VICRYL 0 UR6 27IN ABS (SUTURE) ×2 IMPLANT
SUT VICRYL AB 3-0 FS1 BRD 27IN (SUTURE) ×2 IMPLANT
SYR 10ML LL (SYRINGE) ×4 IMPLANT
SYR BULB IRRIGATION 50ML (SYRINGE) ×2 IMPLANT
SYR CONTROL 10ML LL (SYRINGE) ×2 IMPLANT
TROCAR ENDO BLADELESS 11MM (ENDOMECHANICALS) ×2 IMPLANT
TROCAR XCEL NON-BLD 5MMX100MML (ENDOMECHANICALS) ×2 IMPLANT
TUBING INSUFFLATION (TUBING) ×2 IMPLANT
WARMER LAPAROSCOPE (MISCELLANEOUS) ×2 IMPLANT

## 2018-09-01 NOTE — Op Note (Signed)
Please see the brief operative note for surgical details 

## 2018-09-01 NOTE — Discharge Instructions (Signed)
Diagnostic Laparoscopy Diagnostic laparoscopy is a procedure to diagnose diseases in the abdomen. It might be done for a variety of reasons, such as to look for scar tissue, cancer, or a reason for abdomen (abdominal) pain. During the procedure, a thin, flexible tube that has a light and a camera on the end (laparoscope) is inserted through an incision in the abdomen. The image from the camera is shown on a monitor to help your surgeon see inside your body. Tell a health care provider about:  Any allergies you have.  All medicines you are taking, including vitamins, herbs, eye drops, creams, and over-the-counter medicines.  Any problems you or family members have had with anesthetic medicines.  Any blood disorders you have.  Any surgeries you have had.  Any medical conditions you have. What are the risks? Generally, this is a safe procedure. However, problems may occur, including:  Infection.  Bleeding.  Allergic reactions to medicines or dyes.  Damage to abdominal structures or organs, such as the intestines, liver, stomach, or spleen. What happens before the procedure? Medicines  Ask your health care provider about: ? Changing or stopping your regular medicines. This is especially important if you are taking diabetes medicines or blood thinners. ? Taking medicines such as aspirin and ibuprofen. These medicines can thin your blood. Do not take these medicines unless your health care provider tells you to take them. ? Taking over-the-counter medicines, vitamins, herbs, and supplements.  You may be given antibiotic medicine to help prevent infection. Staying hydrated Follow instructions from your health care provider about hydration, which may include:  Up to 2 hours before the procedure - you may continue to drink clear liquids, such as water, clear fruit juice, black coffee, and plain tea. Eating and drinking restrictions Follow instructions from your health care provider  about eating and drinking, which may include:  8 hours before the procedure - stop eating heavy meals or foods such as meat, fried foods, or fatty foods.  6 hours before the procedure - stop eating light meals or foods, such as toast or cereal.  6 hours before the procedure - stop drinking milk or drinks that contain milk.  2 hours before the procedure - stop drinking clear liquids. General instructions  Ask your health care provider how your surgical site will be marked or identified.  You may be asked to shower with a germ-killing soap.  Plan to have someone take you home from the hospital or clinic.  Plan to have a responsible adult care for you for at least 24 hours after you leave the hospital or clinic. This is important. What happens during the procedure?   To lower your risk of infection: ? Your health care team will wash or sanitize their hands. ? Hair may be removed from the surgical area. ? Your skin will be washed with soap.  An IV will be inserted into one of your veins.  You will be given a medicine to make you fall asleep (general anesthetic). You may also be given a medicine to help you relax (sedative).  A breathing tube will be placed down your throat to help you breathe during the procedure.  Your abdomen will be filled with an air-like gas so it expands. This will give the surgeon more room to operate and will make your organs easier to see.  Many small incisions will be made in your abdomen.  A laparoscope and other surgical instruments will be inserted into your abdomen through the   incisions.  A tissue sample may be removed from an organ for examination (biopsy). This will depend on the reason why you are having this procedure.  The laparoscope and other instruments will be removed from your abdomen.  The gas will be released.  Your incisions will be closed with stitches (sutures) and covered with a bandage (dressing).  Your breathing tube will be  removed. The procedure may vary among health care providers and hospitals. What happens after the procedure?   Your blood pressure, heart rate, breathing rate, and blood oxygen level will be monitored until the medicines you were given have worn off.  Do not drive for 24 hours if you were given a sedative during your procedure.  It is up to you to get the results of your procedure. Ask your health care provider, or the department that is doing the procedure, when your results will be ready. Summary  Diagnostic laparoscopy is a way to look for problems in the abdomen using small incisions.  Follow instructions from your health care provider about how to prepare for the procedure.  Plan to have a responsible adult care for you for at least 24 hours after you leave the hospital or clinic. This is important. This information is not intended to replace advice given to you by your health care provider. Make sure you discuss any questions you have with your health care provider. Document Released: 04/08/2000 Document Revised: 12/13/2016 Document Reviewed: 06/26/2016 Elsevier Patient Education  2020 [employer] Inc.  

## 2018-09-01 NOTE — Anesthesia Preprocedure Evaluation (Signed)
Anesthesia Evaluation  Patient identified by MRN, date of birth, ID band Patient awake  General Assessment Comment:First anesthetic  Reviewed: Allergy & Precautions, NPO status , Patient's Chart, lab work & pertinent test results  Airway Mallampati: II  TM Distance: >3 FB Neck ROM: Full    Dental no notable dental hx. (+) Teeth Intact   Pulmonary neg pulmonary ROS,    Pulmonary exam normal breath sounds clear to auscultation       Cardiovascular Exercise Tolerance: Good negative cardio ROS Normal cardiovascular examI Rhythm:Regular Rate:Normal     Neuro/Psych negative neurological ROS  negative psych ROS   GI/Hepatic negative GI ROS, Neg liver ROS,   Endo/Other  negative endocrine ROS  Renal/GU negative Renal ROS  negative genitourinary   Musculoskeletal  (+) Arthritis , Osteoarthritis,    Abdominal   Peds negative pediatric ROS (+)  Hematology negative hematology ROS (+)   Anesthesia Other Findings   Reproductive/Obstetrics negative OB ROS Here for Dx L/S for IUD removal -which has migrated into pelvis anterior to uterus on last Xray                              Anesthesia Physical Anesthesia Plan  ASA: I  Anesthesia Plan: General   Post-op Pain Management:    Induction: Intravenous  PONV Risk Score and Plan: 3 and Dexamethasone, Ondansetron and Treatment may vary due to age or medical condition  Airway Management Planned: Oral ETT  Additional Equipment:   Intra-op Plan:   Post-operative Plan: Extubation in OR  Informed Consent: I have reviewed the patients History and Physical, chart, labs and discussed the procedure including the risks, benefits and alternatives for the proposed anesthesia with the patient or authorized representative who has indicated his/her understanding and acceptance.     Dental advisory given  Plan Discussed with: CRNA  Anesthesia Plan  Comments: (Plan Full PPE use  Plan GETA -d/w pt -WTP with same after Q&A)        Anesthesia Quick Evaluation

## 2018-09-01 NOTE — Transfer of Care (Signed)
Immediate Anesthesia Transfer of Care Note  Patient: Jennifer Aguilar  Procedure(s) Performed: LAPAROSCOPY DIAGNOSTIC, REMOVAL OF INTRAPERITONEAL INTRAUTERINE DEVICE (N/A Abdomen)  Patient Location: PACU  Anesthesia Type:General  Level of Consciousness: awake, alert , oriented and patient cooperative  Airway & Oxygen Therapy: Patient Spontanous Breathing and Patient connected to face mask oxygen  Post-op Assessment: Report given to RN, Post -op Vital signs reviewed and stable and Patient moving all extremities X 4  Post vital signs: Reviewed and stable  Last Vitals:  Vitals Value Taken Time  BP 129/86 09/01/18 1036  Temp    Pulse 86 09/01/18 1038  Resp 18 09/01/18 1038  SpO2 100 % 09/01/18 1038  Vitals shown include unvalidated device data.  Last Pain:  Vitals:   09/01/18 0902  TempSrc: Oral  PainSc: 0-No pain      Patients Stated Pain Goal: 6 (24/81/85 9093)  Complications: No apparent anesthesia complications

## 2018-09-01 NOTE — Brief Op Note (Signed)
09/01/2018  10:15 AM  PATIENT:  Jennifer Aguilar  23 y.o. female  PRE-OPERATIVE DIAGNOSIS:  Intraperitoneal intrauterine device (Mirena)  POST-OPERATIVE DIAGNOSIS:  Intraperitoneal intrauterine device  PROCEDURE:  Procedure(s): LAPAROSCOPY DIAGNOSTIC, REMOVAL OF INTRAPERITONEAL INTRAUTERINE DEVICE (N/A)  SURGEON:  Surgeon(s) and Role:    Jonnie Kind, MD - Primary  PHYSICIAN ASSISTANT:   ASSISTANTS: Zoila Shutter, CST, Mai Riquier MSIII  ANESTHESIA:   local and general  EBL: Minimal  BLOOD ADMINISTERED:none  DRAINS: none   LOCAL MEDICATIONS USED:  MARCAINE    and Amount: 10 ml  SPECIMEN:  Source of Specimen:  IUD, visually intact, discarded  DISPOSITION OF SPECIMEN:  N/A  COUNTS:  YES  TOURNIQUET:  * No tourniquets in log *  DICTATION: .Dragon Dictation  PLAN OF CARE: Discharge to home after PACU  PATIENT DISPOSITION:  PACU - hemodynamically stable.   Delay start of Pharmacological VTE agent (>24hrs) due to surgical blood loss or risk of bleeding: not applicable Details of procedure: Patient was taken the operating room prepped and draped for combined abdominal and vaginal procedures with legs in low lithotomy support, in and out catheterization of bladder performed by prep team, Ancef administered, timeout conducted and patient and procedure confirmed by surgical team  Speculum was used to visualize the cervix which was extremely anterior and vaginal length with very elongate large snowman speculum was necessary to visualize the cervix sufficiently to grasp it with single-tooth tenaculum and apply the Hulka  uterine manipulator. Attention was then directed to the umbilicus, where an infraumbilical vertical 1 cm skin incision was made as well as a transverse suprapubic incision.  The Veress needle was used to introduce pneumoperitoneum under 7 mm intra-abdominal pressure after water droplet technique confirmed easy flow into the intraperitoneal cavity.   Pneumoperitoneum was completed 5 mm camera used to visualize the pelvis.  The second suprapubic trocar was placed under direct visualization also 5 mm trocar.  The abdominal cavity was visually normal.  At first we did not immediately see the IUD.  The uterus was inspected.  The left ovary was entirely normal, with no adnexal adhesions the left fallopian tube was normal as well as the right tube.  The back of the uterus was visualized and there were no visual abnormalities in the body of the uterus. Further manipulation of the omentum and left lower quadrant structures allowed the tip of the to be identified in the end of the omentum.  The omentum could be flipped in such a position that the entire IUD could be visualized.  The proximal portion the IUD where the string was attached was then grasped and pulled into the suprapubic 5 mm trocar and extracted intact and a photo taken documenting its extraction Pelvis was inspected and confirmed as hemostatic.  The upper abdomen was visually inspected with the camera and was entirely normal there was no adhesions around the liver. Attention was then directed to deflating the abdomen.  120 cc of saline was instilled into the abdomen, the the abdominal pressure deflated, intraperitoneal saline allowed easy removal of the carbon dioxide, the laparoscopic trochars were removed with good hemostasis.  Subcuticular 4-0 Vicryl was then used to close the skin incisions and Marcaine injected around both incision sites.  Sponge and needle counts were correct patient recovery room in stable condition

## 2018-09-01 NOTE — H&P (Signed)
Preoperative History and Physical  Jennifer Aguilar is a 23 y.o. [phone] here for surgical management of intra abdominal IUD. No significant preoperative concerns. Has had IUD in place for about 1.5 year. Never stopped having period, they only got heavier. LMP was about a week ago. Is not using condoms but has not been sexually since her last her period..  Future contraception plans are oral contraceptives which she is taken in the past and did well.  Proposed surgery: diagnostic laparoscopy w/ removal of intra abdominal IUD      Past Medical History:  Diagnosis Date  . Menometrorrhagia 08/29/2014  . Seasonal allergies         Past Surgical History:  Procedure Laterality Date  . NO PAST SURGERIES                     OB History  Gravida Para Term Preterm AB Living  3 3 3     3   SAB TAB Ectopic Multiple Live Births           0 3       # Outcome Date GA Lbr Len/2nd Weight Sex Delivery Anes PTL Lv  3 Term 09/07/16 [redacted]w[redacted]d / 00:05 7 lb 2.5 oz (3.245 kg) M Vag-Spont None  LIV  2 Term 06/24/14 [redacted]w[redacted]d 00:38 / 00:02 6 lb 6.5 oz (2.905 kg) M Vag-Spont None N LIV  1 Term 05/15/13 [redacted]w[redacted]d / 00:08 5 lb 15.6 oz (2.71 kg) F Vag-Spont None N LIV     Complications: Gestational hypertension  Patient denies any other pertinent gynecologic issues.         Current Outpatient Medications on File Prior to Visit  Medication Sig Dispense Refill  . levonorgestrel (MIRENA) 20 MCG/24HR IUD 1 each by Intrauterine route once.    . triamcinolone cream (KENALOG) 0.1 % APPLY  CREAM EXTERNALLY TO AFFECTED AREA TWICE DAILY 45 g 3   No current facility-administered medications on file prior to visit.         Allergies  Allergen Reactions  . Meloxicam     REACTION: increased heart rate, body numbness; tolerates ibuprofen, per pt    Social History:   reports that she has never smoked. She has never used smokeless tobacco. She reports that she does not drink alcohol or use drugs.       Family  History  Problem Relation Age of Onset  . Diabetes Mother   . Hypertension Mother   . Hypertension Father   . Heart failure Maternal Grandfather   . Hypertension Sister   . Hypertension Paternal Grandfather   . Heart failure Maternal Grandmother   . Diabetes Maternal Grandmother   . Cancer Paternal Aunt   . Hypertension Paternal Uncle     Review of Systems: Noncontributory no abdominal pain, no bowel or bladder abnormality.  PHYSICAL EXAM: Blood pressure 133/78, pulse 92, height 5\' 5"  (1.651 m), weight 195 lb 6.4 oz (88.6 kg), last menstrual period 08/17/2018, not currently breastfeeding. General appearance - alert, well appearing, and in no distress Chest - clear to auscultation, no wheezes, rales or rhonchi, symmetric air entry Heart - normal rate and regular rhythm Abdomen - soft, nontender, nondistended, no masses or organomegaly Pelvic - VAGINA: normal appearing vagina with CERVIX: normal appearing cervix  UTERUS: anteverted  Extremities - peripheral pulses normal, no pedal edema, no clubbing or cyanosis  Labs:  CBC    Component Value Date/Time   WBC 6.7 08/28/2018 1331   RBC 4.60  08/28/2018 1331   HGB 13.4 08/28/2018 1331   HGB 14.4 07/28/2018 1247   HCT 42.1 08/28/2018 1331   HCT 43.1 07/28/2018 1247   PLT 305 08/28/2018 1331   PLT 361 07/28/2018 1247   MCV 91.5 08/28/2018 1331   MCV 87 07/28/2018 1247   MCH 29.1 08/28/2018 1331   MCHC 31.8 08/28/2018 1331   RDW 13.5 08/28/2018 1331   RDW 13.2 07/28/2018 1247   LYMPHSABS 2.3 05/26/2012 1400   MONOABS 0.7 05/26/2012 1400   EOSABS 0.2 05/26/2012 1400   BASOSABS 0.1 05/26/2012 1400   CMP Latest Ref Rng & Units 08/28/2018 07/03/2013 05/10/2013  Glucose 70 - 99 mg/dL 83 86 40(J61(L)  BUN 6 - 20 mg/dL 7 9 11   Creatinine 0.44 - 1.00 mg/dL 8.110.66 9.140.68 7.820.58  Sodium 135 - 145 mmol/L 139 138 137  Potassium 3.5 - 5.1 mmol/L 3.9 3.9 3.9  Chloride 98 - 111 mmol/L 108 107 106  CO2 22 - 32 mmol/L 22 25 22    Calcium 8.9 - 10.3 mg/dL 9.0 8.7 8.8  Total Protein 6.5 - 8.1 g/dL 7.8 - 5.8(L)  Total Bilirubin 0.3 - 1.2 mg/dL 0.5 - 0.3  Alkaline Phos 38 - 126 U/L 83 - 115  AST 15 - 41 U/L 17 - 17  ALT 0 - 44 U/L 21 - 10     Imaging Studies:  Imaging Results  Dg Abd 1 View  Result Date: 07/28/2018 CLINICAL DATA:  Missing IUD EXAM: ABDOMEN - 1 VIEW COMPARISON:  None. FINDINGS: IUD is seen in the left lower quadrant the abdomen overlying the left iliac wing. Several pelvic phleboliths also noted. The bowel gas pattern is normal. IMPRESSION: Abnormal IUD location in left lower quadrant, overlying the left iliac wing. Electronically Signed   By: Danae OrleansJohn A Stahl M.D.   On: 07/28/2018 17:17     Assessment:     Patient Active Problem List   Diagnosis Date Noted  . Encounter for IUD insertion 10/22/2016  . Abnormal Pap smear of cervix 05/15/2016  . History of chlamydia 11/07/2012  . 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 PATELLO-FEMORAL SYNDROME 06/01/2008    Plan: Patient will undergo surgical management with diagnostic laparoscopy w/ removal of  Intraabdominal IUD 09/01/18.  Plans for OCP until desire for pregnancy.

## 2018-09-01 NOTE — Anesthesia Procedure Notes (Signed)
Procedure Name: Intubation Date/Time: 09/01/2018 9:27 AM Performed by: Alvy Bimler, CRNA Pre-anesthesia Checklist: Patient identified, Patient being monitored, Timeout performed, Emergency Drugs available and Suction available Patient Re-evaluated:Patient Re-evaluated prior to induction Oxygen Delivery Method: Circle System Utilized Preoxygenation: Pre-oxygenation with 100% oxygen Induction Type: IV induction Ventilation: Mask ventilation without difficulty Laryngoscope Size: Mac and 3 Grade View: Grade II Tube type: Oral Tube size: 7.0 mm Number of attempts: 1 Airway Equipment and Method: stylet Placement Confirmation: ETT inserted through vocal cords under direct vision,  positive ETCO2 and breath sounds checked- equal and bilateral Secured at: 21 cm Tube secured with: Tape Dental Injury: Teeth and Oropharynx as per pre-operative assessment

## 2018-09-01 NOTE — Anesthesia Postprocedure Evaluation (Signed)
Anesthesia Post Note  Patient: Alfredia Client  Procedure(s) Performed: LAPAROSCOPY DIAGNOSTIC, REMOVAL OF INTRAPERITONEAL INTRAUTERINE DEVICE (N/A Abdomen)  Patient location during evaluation: PACU Anesthesia Type: General Level of consciousness: awake and alert and patient cooperative Pain management: pain level controlled Vital Signs Assessment: post-procedure vital signs reviewed and stable Respiratory status: spontaneous breathing, respiratory function stable and nonlabored ventilation Cardiovascular status: blood pressure returned to baseline Postop Assessment: no apparent nausea or vomiting Anesthetic complications: no     Last Vitals:  Vitals:   09/01/18 1100 09/01/18 1111  BP: 127/81 130/87  Pulse: 73 92  Resp: 15 14  Temp:  36.6 C  SpO2: 98% 100%    Last Pain:  Vitals:   09/01/18 1111  TempSrc: Oral  PainSc: 0-No pain                 Elizabeth Paulsen J

## 2018-09-02 ENCOUNTER — Encounter (HOSPITAL_COMMUNITY): Payer: Self-pay | Admitting: Obstetrics and Gynecology

## 2018-09-04 ENCOUNTER — Other Ambulatory Visit: Payer: Medicaid Other | Admitting: Adult Health

## 2018-09-09 ENCOUNTER — Encounter: Payer: Medicaid Other | Admitting: Obstetrics and Gynecology

## 2018-09-14 ENCOUNTER — Other Ambulatory Visit: Payer: Self-pay | Admitting: *Deleted

## 2018-09-14 NOTE — Telephone Encounter (Signed)
Patient has requested Megace Rx. She recently Had a misplaced IUD removed from abdominal cavity.  I will need to speak directly with pt to clarify her medications , as I thought she was going to use OCP's postop. She should likely have a resumption of normal cycles if she is not on any medication, as a medication review suggests that she is NOT yet taking any contraception.  Message left for pt to call office tomorrow after 3/.

## 2018-09-16 ENCOUNTER — Encounter: Payer: Self-pay | Admitting: Obstetrics and Gynecology

## 2018-09-16 ENCOUNTER — Other Ambulatory Visit: Payer: Self-pay

## 2018-09-16 ENCOUNTER — Ambulatory Visit (INDEPENDENT_AMBULATORY_CARE_PROVIDER_SITE_OTHER): Payer: Medicaid Other | Admitting: Obstetrics and Gynecology

## 2018-09-16 VITALS — BP 120/75 | HR 91 | Ht 65.0 in | Wt 196.4 lb

## 2018-09-16 DIAGNOSIS — Z09 Encounter for follow-up examination after completed treatment for conditions other than malignant neoplasm: Secondary | ICD-10-CM

## 2018-09-16 DIAGNOSIS — Z3009 Encounter for other general counseling and advice on contraception: Secondary | ICD-10-CM

## 2018-09-16 DIAGNOSIS — Z9889 Other specified postprocedural states: Secondary | ICD-10-CM

## 2018-09-16 MED ORDER — NORGESTIMATE-ETH ESTRADIOL 0.25-35 MG-MCG PO TABS: 1.0000 | ORAL_TABLET | Freq: Every day | ORAL | 11 refills | Status: DC

## 2018-09-16 NOTE — Progress Notes (Signed)
Patient ID: Jennifer Aguilar, female   DOB: [birth year], 23 y.o.   MRN: [id number]  Subjective:  Jennifer Aguilar is a 23 y.o. female now 2 weeks status post laparoscopy and removal of IUD.    Is doing well, she needs contraception, has been bleeding since IUD removal. NOT sexually active at present. Review of Systems Negative except none   Diet:   normal   Bowel movements : normal.  Pain is controlled with current analgesics. Medications being used: narcotic analgesics including codeine/acetaminophen (Tylenol w/ Codeine).  Objective:  LMP 08/17/2018  General:Well developed, well nourished.  No acute distress. Abdomen: Bowel sounds normal, soft, non-tender. Pelvic Exam: Not examined  Incision(s): Healing well, removal of under skin sitch    Assessment:  Post-Op 2 weeks s/p laparoscopy and removal of IUD   Doing well postoperatively.   Plan:  1. OCP for [facility] 2. Removal of stitch 3. Activity restrictions: none 4. return to work: not applicable. 5. Follow up in PRN   By signing my name below, I, Samul Dada, attest that this documentation has been prepared under the direction and in the presence of Jonnie Kind, MD. Electronically Signed: [place]. 09/16/18. 1:51 PM.  I personally performed the services described in this documentation, which was SCRIBED in my presence. The recorded information has been reviewed and considered accurate. It has been edited as necessary during review. Jonnie Kind, MD

## 2018-11-23 ENCOUNTER — Ambulatory Visit (INDEPENDENT_AMBULATORY_CARE_PROVIDER_SITE_OTHER): Payer: Medicaid Other | Admitting: *Deleted

## 2018-11-23 ENCOUNTER — Other Ambulatory Visit: Payer: Self-pay

## 2018-11-23 ENCOUNTER — Telehealth: Payer: Self-pay | Admitting: Obstetrics & Gynecology

## 2018-11-23 ENCOUNTER — Encounter: Payer: Self-pay | Admitting: *Deleted

## 2018-11-23 DIAGNOSIS — Z3201 Encounter for pregnancy test, result positive: Secondary | ICD-10-CM | POA: Diagnosis not present

## 2018-11-23 LAB — POCT URINE PREGNANCY: Preg Test, Ur: POSITIVE — AB

## 2018-11-23 MED ORDER — ONDANSETRON 8 MG PO TBDP: 8.0000 mg | ORAL_TABLET | Freq: Three times a day (TID) | ORAL | 0 refills | Status: DC | PRN

## 2018-11-23 NOTE — Telephone Encounter (Signed)
Yes the concern of zofran causing increased risk of heart defects and clefting was disproved in subsequent studies.  There is no reason not to take zofran in the first trimester.    If she desires something different like diclegis that is fine but zofran in the first trimester is not precluded any longer and has not been for probably about 2 years

## 2018-11-23 NOTE — Progress Notes (Addendum)
   NURSE VISIT- PREGNANCY CONFIRMATION   SUBJECTIVE:  Jennifer Aguilar is a 23 y.o. [phone] female at [redacted]w[redacted]d by certain LMP of Patient's last menstrual period was 09/29/2018. Here for pregnancy confirmation.  Home pregnancy test: positive x 2  She reports nausea. She is taking prenatal vitamins.    OBJECTIVE:  LMP 09/29/2018   Appears well, in no apparent distress OB History  Gravida Para Term Preterm AB Living  4 3 3     3   SAB TAB Ectopic Multiple Live Births        0 3    # Outcome Date GA Lbr Len/2nd Weight Sex Delivery Anes PTL Lv  4 Current           3 Term 09/07/16 [redacted]w[redacted]d / 00:05 7 lb 2.5 oz (3.245 kg) M Vag-Spont None  LIV  2 Term 06/24/14 [redacted]w[redacted]d 00:38 / 00:02 6 lb 6.5 oz (2.905 kg) M Vag-Spont None N LIV  1 Term 05/15/13 [redacted]w[redacted]d / 00:08 5 lb 15.6 oz (2.71 kg) F Vag-Spont None N LIV     Complications: Gestational hypertension    Results for orders placed or performed in visit on 11/23/18 (from the past 24 hour(s))  POCT urine pregnancy   Collection Time: 11/23/18  1:39 PM  Result Value Ref Range   Preg Test, Ur Positive (A) Negative    ASSESSMENT: Positive pregnancy test, [redacted]w[redacted]d by LMP    PLAN: Schedule for dating ultrasound in 2-3 weeks Prenatal vitamins: continue   Nausea medicines: pt requests nausea meds   OB packet given: Yes  Levy Pupa  11/23/2018 1:44 PM   Attestation of Attending Supervision of Nursing Visit Encounter: Evaluation and management procedures were performed by the nursing staff under my supervision and collaboration.  I have reviewed the nurse's note and chart, and I agree with the management and plan.  Jacelyn Grip MD Attending Physician for the [facility] 11/23/2018 2:04 PM

## 2018-11-24 NOTE — Telephone Encounter (Signed)
Left message letting pt know nausea med was sent to pharmacy. Jennifer Aguilar

## 2018-12-04 ENCOUNTER — Other Ambulatory Visit: Payer: Self-pay | Admitting: Obstetrics and Gynecology

## 2018-12-04 DIAGNOSIS — O3680X0 Pregnancy with inconclusive fetal viability, not applicable or unspecified: Secondary | ICD-10-CM

## 2018-12-14 ENCOUNTER — Ambulatory Visit (INDEPENDENT_AMBULATORY_CARE_PROVIDER_SITE_OTHER): Payer: Medicaid Other

## 2018-12-14 ENCOUNTER — Other Ambulatory Visit: Payer: Self-pay

## 2018-12-14 DIAGNOSIS — O3680X0 Pregnancy with inconclusive fetal viability, not applicable or unspecified: Secondary | ICD-10-CM

## 2018-12-14 DIAGNOSIS — Z3A10 10 weeks gestation of pregnancy: Secondary | ICD-10-CM

## 2018-12-14 NOTE — Progress Notes (Signed)
[place] 10+6 wks,single IUP,FHR 173 bpm,normal ovaries bilat,crl 49.45 mm

## 2018-12-18 ENCOUNTER — Other Ambulatory Visit: Payer: Self-pay | Admitting: Obstetrics & Gynecology

## 2018-12-18 DIAGNOSIS — Z3682 Encounter for antenatal screening for nuchal translucency: Secondary | ICD-10-CM

## 2018-12-21 ENCOUNTER — Ambulatory Visit (INDEPENDENT_AMBULATORY_CARE_PROVIDER_SITE_OTHER): Payer: Medicaid Other

## 2018-12-21 ENCOUNTER — Other Ambulatory Visit: Payer: Medicaid Other

## 2018-12-21 ENCOUNTER — Other Ambulatory Visit: Payer: Self-pay

## 2018-12-21 DIAGNOSIS — Z3A11 11 weeks gestation of pregnancy: Secondary | ICD-10-CM

## 2018-12-21 DIAGNOSIS — Z3682 Encounter for antenatal screening for nuchal translucency: Secondary | ICD-10-CM | POA: Diagnosis not present

## 2018-12-21 DIAGNOSIS — Z3401 Encounter for supervision of normal first pregnancy, first trimester: Secondary | ICD-10-CM | POA: Diagnosis not present

## 2018-12-21 DIAGNOSIS — Z1379 Encounter for other screening for genetic and chromosomal anomalies: Secondary | ICD-10-CM | POA: Diagnosis not present

## 2018-12-21 NOTE — Progress Notes (Signed)
[place] 11+6 wks,measurements c/w dates,62.81 mm,fhr 167 bpm,normal ovaries (limited view),NT 1.8 mm,NB present

## 2018-12-22 ENCOUNTER — Encounter: Payer: Self-pay | Admitting: Women's Health

## 2018-12-23 LAB — INTEGRATED 1
Crown Rump Length: 62.8 mm
Gest. Age on Collection Date: 12.6 weeks
Maternal Age at EDD: 24.4 yr
Nuchal Translucency (NT): 1.8 mm
Number of Fetuses: 1
PAPP-A Value: 829.6 ng/mL
Weight: 199 [lb_av]

## 2018-12-23 LAB — OBSTETRIC PANEL, INCLUDING HIV
Antibody Screen: NEGATIVE
Basophils Absolute: 0 10*3/uL (ref 0.0–0.2)
Basos: 0 %
EOS (ABSOLUTE): 0.1 10*3/uL (ref 0.0–0.4)
Eos: 1 %
HIV Screen 4th Generation wRfx: NONREACTIVE
Hematocrit: 42.5 % (ref 34.0–46.6)
Hemoglobin: 14.4 g/dL (ref 11.1–15.9)
Hepatitis B Surface Ag: NEGATIVE
Immature Grans (Abs): 0 10*3/uL (ref 0.0–0.1)
Immature Granulocytes: 0 %
Lymphocytes Absolute: 2 10*3/uL (ref 0.7–3.1)
Lymphs: 19 %
MCH: 29.8 pg (ref 26.6–33.0)
MCHC: 33.9 g/dL (ref 31.5–35.7)
MCV: 88 fL (ref 79–97)
Monocytes Absolute: 0.5 10*3/uL (ref 0.1–0.9)
Monocytes: 5 %
Neutrophils Absolute: 7.4 10*3/uL — ABNORMAL HIGH (ref 1.4–7.0)
Neutrophils: 75 %
Platelets: 322 10*3/uL (ref 150–450)
RBC: 4.84 x10E6/uL (ref 3.77–5.28)
RDW: 12.9 % (ref 11.7–15.4)
RPR Ser Ql: NONREACTIVE
Rh Factor: NEGATIVE
Rubella Antibodies, IGG: 3.81 index (ref >0.99)
WBC: 10.1 10*3/uL (ref 3.4–10.8)

## 2018-12-23 LAB — SICKLE CELL SCREEN: Sickle Cell Screen: NEGATIVE

## 2019-01-04 ENCOUNTER — Ambulatory Visit (INDEPENDENT_AMBULATORY_CARE_PROVIDER_SITE_OTHER): Payer: Medicaid Other | Admitting: Women's Health

## 2019-01-04 ENCOUNTER — Other Ambulatory Visit: Payer: Self-pay

## 2019-01-04 ENCOUNTER — Encounter: Payer: Self-pay | Admitting: Women's Health

## 2019-01-04 ENCOUNTER — Ambulatory Visit: Payer: Medicaid Other | Admitting: *Deleted

## 2019-01-04 ENCOUNTER — Other Ambulatory Visit (HOSPITAL_COMMUNITY)
Admission: RE | Admit: 2019-01-04 | Discharge: 2019-01-04 | Disposition: A | Discharge status: Home / Self Care | Payer: Medicaid Other | Source: Ambulatory Visit | Attending: Obstetrics & Gynecology | Admitting: Obstetrics & Gynecology

## 2019-01-04 VITALS — BP 134/88 | HR 100 | Wt 200.0 lb

## 2019-01-04 DIAGNOSIS — Z3481 Encounter for supervision of other normal pregnancy, first trimester: Secondary | ICD-10-CM | POA: Insufficient documentation

## 2019-01-04 DIAGNOSIS — Z363 Encounter for antenatal screening for malformations: Secondary | ICD-10-CM

## 2019-01-04 DIAGNOSIS — Z331 Pregnant state, incidental: Secondary | ICD-10-CM | POA: Insufficient documentation

## 2019-01-04 DIAGNOSIS — Z124 Encounter for screening for malignant neoplasm of cervix: Secondary | ICD-10-CM

## 2019-01-04 DIAGNOSIS — Z3490 Encounter for supervision of normal pregnancy, unspecified, unspecified trimester: Secondary | ICD-10-CM | POA: Insufficient documentation

## 2019-01-04 DIAGNOSIS — Z8759 Personal history of other complications of pregnancy, childbirth and the puerperium: Secondary | ICD-10-CM | POA: Insufficient documentation

## 2019-01-04 DIAGNOSIS — Z1389 Encounter for screening for other disorder: Secondary | ICD-10-CM | POA: Diagnosis not present

## 2019-01-04 DIAGNOSIS — Z3A13 13 weeks gestation of pregnancy: Secondary | ICD-10-CM | POA: Diagnosis not present

## 2019-01-04 DIAGNOSIS — Z0283 Encounter for blood-alcohol and blood-drug test: Secondary | ICD-10-CM | POA: Diagnosis not present

## 2019-01-04 DIAGNOSIS — Z23 Encounter for immunization: Secondary | ICD-10-CM

## 2019-01-04 LAB — POCT URINALYSIS DIPSTICK OB
Blood, UA: NEGATIVE
Glucose, UA: NEGATIVE
Ketones, UA: NEGATIVE
Leukocytes, UA: NEGATIVE
Nitrite, UA: NEGATIVE
POC,PROTEIN,UA: NEGATIVE

## 2019-01-04 MED ORDER — ASPIRIN EC 81 MG PO TBEC: 162.0000 mg | DELAYED_RELEASE_TABLET | Freq: Every day | ORAL | 8 refills | Status: DC

## 2019-01-04 MED ORDER — BLOOD PRESSURE MONITOR MISC: 0 refills | Status: DC

## 2019-01-04 NOTE — Patient Instructions (Addendum)
Jennifer Aguilar, I greatly value your feedback.  If you receive a survey following your visit with [place] today, we appreciate you taking the time to fill it out.  Thanks, [name], CNM, [facility]  [facility] HAS MOVED!!! It is now [facility] at [facility] ([address] [place], [place] [address]) Entrance located off of [address] Free 24/7 valet parking   Begin taking 162mg  (two 81mg  tablets) baby aspirin daily to decrease the risk of preeclampsia during pregnancy    Nausea & Vomiting  Have saltine crackers or pretzels by your bed and eat a few bites before you raise your head out of bed in the morning  Eat small frequent meals throughout the day instead of large meals  Drink plenty of fluids throughout the day to stay hydrated, just don't drink a lot of fluids with your meals.  This can make your stomach fill up faster making you feel sick  Do not brush your teeth right after you eat  Products with real ginger are good for nausea, like ginger ale and ginger hard candy Make sure it says made with real ginger!  Sucking on sour candy like lemon heads is also good for nausea  If your prenatal vitamins make you nauseated, take them at night so you will sleep through the nausea  Sea Bands  If you feel like you need medicine for the nausea & vomiting please let know  If you are unable to keep any fluids or food down please let know   Constipation  Drink plenty of fluid, preferably water, throughout the day  Eat foods high in fiber such as fruits, vegetables, and grains  Exercise, such as walking, is a good way to keep your bowels regular  Drink warm fluids, especially warm prune juice, or decaf coffee  Eat a 1/2 cup of real oatmeal (not instant), 1/2 cup applesauce, and 1/2-1 cup warm prune juice every day  If needed, you may take Colace (docusate sodium) stool softener once or twice a day to help keep the stool soft.   If you still are having  problems with constipation, you may take Miralax once daily as needed to help keep your bowels regular.   Home Blood Pressure Monitoring for Patients   Your provider has recommended that you check your blood pressure (BP) at least once a week at home. If you do not have a blood pressure cuff at home, one will be provided for you. Contact your provider if you have not received your monitor within 1 week.   Helpful Tips for Accurate Home Blood Pressure Checks  . Don't smoke, exercise, or drink caffeine 30 minutes before checking your BP . Use the restroom before checking your BP (a full bladder can raise your pressure) . Relax in a comfortable upright chair . Feet on the ground . Left arm resting comfortably on a flat surface at the level of your heart . Legs uncrossed . Back supported . Sit quietly and don't talk . Place the cuff on your bare arm . Adjust snuggly, so that only two fingertips can fit between your skin and the top of the cuff . Check 2 readings separated by at least one minute . Keep a log of your BP readings . For a visual, please reference this diagram: http://ccnc.care/bpdiagram  Provider Name: [facility]     Phone: [phone]  Zone 1: ALL CLEAR  Continue to monitor your symptoms:  .  BP reading is less than 140 (top number) or less than 90 (bottom number)  . No right upper stomach pain . No headaches or seeing spots . No feeling nauseated or throwing up . No swelling in face and hands  Zone 2: CAUTION Call your doctor's office for any of the following:  . BP reading is greater than 140 (top number) or greater than 90 (bottom number)  . Stomach pain under your ribs in the middle or right side . Headaches or seeing spots . Feeling nauseated or throwing up . Swelling in face and hands  Zone 3: EMERGENCY  Seek immediate medical care if you have any of the following:  . BP reading is greater than160 (top number) or greater than 110 (bottom  number) . Severe headaches not improving with Tylenol . Serious difficulty catching your breath . Any worsening symptoms from Zone 2    First Trimester of Pregnancy The first trimester of pregnancy is from week 1 until the end of week 12 (months 1 through 3). A week after a sperm fertilizes an egg, the egg will implant on the wall of the uterus. This embryo will begin to develop into a baby. Genes from you and your partner are forming the baby. The female genes determine whether the baby is a boy or a girl. At 6-8 weeks, the eyes and face are formed, and the heartbeat can be seen on ultrasound. At the end of 12 weeks, all the baby's organs are formed.  Now that you are pregnant, you will want to do everything you can to have a healthy baby. Two of the most important things are to get good prenatal care and to follow your health care provider's instructions. Prenatal care is all the medical care you receive before the baby's birth. This care will help prevent, find, and treat any problems during the pregnancy and childbirth. BODY CHANGES Your body goes through many changes during pregnancy. The changes vary from woman to woman.   You may gain or lose a couple of pounds at first.  You may feel sick to your stomach (nauseous) and throw up (vomit). If the vomiting is uncontrollable, call your health care provider.  You may tire easily.  You may develop headaches that can be relieved by medicines approved by your health care provider.  You may urinate more often. Painful urination may mean you have a bladder infection.  You may develop heartburn as a result of your pregnancy.  You may develop constipation because certain hormones are causing the muscles that push waste through your intestines to slow down.  You may develop hemorrhoids or swollen, bulging veins (varicose veins).  Your breasts may begin to grow larger and become tender. Your nipples may stick out more, and the tissue that surrounds  them (areola) may become darker.  Your gums may bleed and may be sensitive to brushing and flossing.  Dark spots or blotches (chloasma, mask of pregnancy) may develop on your face. This will likely fade after the baby is born.  Your menstrual periods will stop.  You may have a loss of appetite.  You may develop cravings for certain kinds of food.  You may have changes in your emotions from day to day, such as being excited to be pregnant or being concerned that something may go wrong with the pregnancy and baby.  You may have more vivid and strange dreams.  You may have changes in your hair. These can include thickening of your  hair, rapid growth, and changes in texture. Some women also have hair loss during or after pregnancy, or hair that feels dry or thin. Your hair will most likely return to normal after your baby is born. WHAT TO EXPECT AT YOUR PRENATAL VISITS During a routine prenatal visit:  You will be weighed to make sure you and the baby are growing normally.  Your blood pressure will be taken.  Your abdomen will be measured to track your baby's growth.  The fetal heartbeat will be listened to starting around week 10 or 12 of your pregnancy.  Test results from any previous visits will be discussed. Your health care provider may ask you:  How you are feeling.  If you are feeling the baby move.  If you have had any abnormal symptoms, such as leaking fluid, bleeding, severe headaches, or abdominal cramping.  If you have any questions. Other tests that may be performed during your first trimester include:  Blood tests to find your blood type and to check for the presence of any previous infections. They will also be used to check for low iron levels (anemia) and Rh antibodies. Later in the pregnancy, blood tests for diabetes will be done along with other tests if problems develop.  Urine tests to check for infections, diabetes, or protein in the urine.  An ultrasound  to confirm the proper growth and development of the baby.  An amniocentesis to check for possible genetic problems.  Fetal screens for spina bifida and Down syndrome.  You may need other tests to make sure you and the baby are doing well. HOME CARE INSTRUCTIONS  Medicines  Follow your health care provider's instructions regarding medicine use. Specific medicines may be either safe or unsafe to take during pregnancy.  Take your prenatal vitamins as directed.  If you develop constipation, try taking a stool softener if your health care provider approves. Diet  Eat regular, well-balanced meals. Choose a variety of foods, such as meat or vegetable-based protein, fish, milk and low-fat dairy products, vegetables, fruits, and whole grain breads and cereals. Your health care provider will help you determine the amount of weight gain that is right for you.  Avoid raw meat and uncooked cheese. These carry germs that can cause birth defects in the baby.  Eating four or five small meals rather than three large meals a day may help relieve nausea and vomiting. If you start to feel nauseous, eating a few soda crackers can be helpful. Drinking liquids between meals instead of during meals also seems to help nausea and vomiting.  If you develop constipation, eat more high-fiber foods, such as fresh vegetables or fruit and whole grains. Drink enough fluids to keep your urine clear or pale yellow. Activity and Exercise  Exercise only as directed by your health care provider. Exercising will help you:  Control your weight.  Stay in shape.  Be prepared for labor and delivery.  Experiencing pain or cramping in the lower abdomen or low back is a good sign that you should stop exercising. Check with your health care provider before continuing normal exercises.  Try to avoid standing for long periods of time. Move your legs often if you must stand in one place for a long time.  Avoid heavy  lifting.  Wear low-heeled shoes, and practice good posture.  You may continue to have sex unless your health care provider directs you otherwise. Relief of Pain or Discomfort  Wear a good support bra for breast  tenderness.    Take warm sitz baths to soothe any pain or discomfort caused by hemorrhoids. Use hemorrhoid cream if your health care provider approves.    Rest with your legs elevated if you have leg cramps or low back pain.  If you develop varicose veins in your legs, wear support hose. Elevate your feet for 15 minutes, 3-4 times a day. Limit salt in your diet. Prenatal Care  Schedule your prenatal visits by the twelfth week of pregnancy. They are usually scheduled monthly at first, then more often in the last 2 months before delivery.  Write down your questions. Take them to your prenatal visits.  Keep all your prenatal visits as directed by your health care provider. Safety  Wear your seat belt at all times when driving.  Make a list of emergency phone numbers, including numbers for family, friends, the hospital, and police and fire departments. General Tips  Ask your health care provider for a referral to a local prenatal education class. Begin classes no later than at the beginning of month 6 of your pregnancy.  Ask for help if you have counseling or nutritional needs during pregnancy. Your health care provider can offer advice or refer you to specialists for help with various needs.  Do not use hot tubs, steam rooms, or saunas.  Do not douche or use tampons or scented sanitary pads.  Do not cross your legs for long periods of time.  Avoid cat litter boxes and soil used by cats. These carry germs that can cause birth defects in the baby and possibly loss of the fetus by miscarriage or stillbirth.  Avoid all smoking, herbs, alcohol, and medicines not prescribed by your health care provider. Chemicals in these affect the formation and growth of the baby.  Schedule  a dentist appointment. At home, brush your teeth with a soft toothbrush and be gentle when you floss. SEEK MEDICAL CARE IF:   You have dizziness.  You have mild pelvic cramps, pelvic pressure, or nagging pain in the abdominal area.  You have persistent nausea, vomiting, or diarrhea.  You have a bad smelling vaginal discharge.  You have pain with urination.  You notice increased swelling in your face, hands, legs, or ankles. SEEK IMMEDIATE MEDICAL CARE IF:   You have a fever.  You are leaking fluid from your vagina.  You have spotting or bleeding from your vagina.  You have severe abdominal cramping or pain.  You have rapid weight gain or loss.  You vomit blood or material that looks like coffee grounds.  You are exposed to [place] measles and have never had them.  You are exposed to fifth disease or chickenpox.  You develop a severe headache.  You have shortness of breath.  You have any kind of trauma, such as from a fall or a car accident. Document Released: 12/25/2000 Document Revised: 05/17/2013 Document Reviewed: 11/10/2012 [facility] Patient Information 2015 [place], [place]. This information is not intended to replace advice given to you by your health care provider. Make sure you discuss any questions you have with your health care provider.  Coronavirus (COVID-19) Are you at risk?  Are you at risk for the Coronavirus (COVID-19)?  To be considered HIGH RISK for Coronavirus (COVID-19), you have to meet the following criteria:  . Traveled to [place], [place], [place], [place] or [place]; or in the [place] to [place], [place], [place], or [place]; and have fever, cough, and shortness of breath within the last  2 weeks of travel OR . Been in close contact with a person diagnosed with COVID-19 within the last 2 weeks and have fever, cough, and shortness of breath . IF YOU DO NOT MEET THESE CRITERIA, YOU ARE CONSIDERED LOW RISK FOR COVID-19.  What to do if  you are HIGH RISK for COVID-19?  Marland Kitchen If you are having a medical emergency, call 911. . Seek medical care right away. Before you go to a doctor's office, urgent care or emergency department, call ahead and tell them about your recent travel, contact with someone diagnosed with COVID-19, and your symptoms. You should receive instructions from your physician's office regarding next steps of care.  . When you arrive at healthcare provider, tell the healthcare staff immediately you have returned from visiting [place], [place], [place], [place] or [place]; or traveled in the [place] to [place], [place], [place], or [place]; in the last two weeks or you have been in close contact with a person diagnosed with COVID-19 in the last 2 weeks.   . Tell the health care staff about your symptoms: fever, cough and shortness of breath. . After you have been seen by a medical provider, you will be either: o Tested for (COVID-19) and discharged home on quarantine except to seek medical care if symptoms worsen, and asked to  - Stay home and avoid contact with others until you get your results (4-5 days)  - Avoid travel on public transportation if possible (such as bus, train, or airplane) or o Sent to the Emergency Department by EMS for evaluation, COVID-19 testing, and possible admission depending on your condition and test results.  What to do if you are LOW RISK for COVID-19?  Reduce your risk of any infection by using the same precautions used for avoiding the common cold or flu:  Marland Kitchen Wash your hands often with soap and warm water for at least 20 seconds.  If soap and water are not readily available, use an alcohol-based hand sanitizer with at least 60% alcohol.  . If coughing or sneezing, cover your mouth and nose by coughing or sneezing into the elbow areas of your shirt or coat, into a tissue or into your sleeve (not your hands). . Avoid shaking hands with others and consider head nods or verbal  greetings only. . Avoid touching your eyes, nose, or mouth with unwashed hands.  . Avoid close contact with people who are sick. . Avoid places or events with large numbers of people in one location, like concerts or sporting events. . Carefully consider travel plans you have or are making. . If you are planning any travel outside or inside the [place], visit the CDC's Travelers' Health webpage for the latest health notices. . If you have some symptoms but not all symptoms, continue to monitor at home and seek medical attention if your symptoms worsen. . If you are having a medical emergency, call 911.   ADDITIONAL HEALTHCARE OPTIONS FOR PATIENTS  [facility] / e-Visit: [email or web address]         [facility]: [phone]  [facility]: [phone]                   [facility]: [phone]

## 2019-01-04 NOTE — Progress Notes (Signed)
INITIAL OBSTETRICAL VISIT Patient name: Jennifer Aguilar MRN [id number]  Date of birth: 1995-12-07 Chief Complaint:   Initial Prenatal Visit  History of Present Illness:   Jennifer Aguilar is a 23 y.o. [id number] Caucasian female at [redacted]w[redacted]d by LMP c/w 11wk u/s, with an Estimated Date of Delivery: 07/06/19 being seen today for her initial obstetrical visit.   Her obstetrical history is significant for term SVB x3, GHTN w/ 1st.   Today she reports no complaints.  Patient's last menstrual period was 09/29/2018. Last pap 2018. Results were: ASCUS w/ -HRHPV Review of Systems:   Pertinent items are noted in HPI Denies cramping/contractions, leakage of fluid, vaginal bleeding, abnormal vaginal discharge w/ itching/odor/irritation, headaches, visual changes, shortness of breath, chest pain, abdominal pain, severe nausea/vomiting, or problems with urination or bowel movements unless otherwise stated above.  Pertinent History Reviewed:  Reviewed past medical,surgical, social, obstetrical and family history.  Reviewed problem list, medications and allergies. OB History  Gravida Para Term Preterm AB Living  4 3 3     3   SAB TAB Ectopic Multiple Live Births        0 3    # Outcome Date GA Lbr Len/2nd Weight Sex Delivery Anes PTL Lv  4 Current           3 Term 09/07/16 [redacted]w[redacted]d / 00:05 7 lb 2.5 oz (3.245 kg) M Vag-Spont None N LIV  2 Term 06/24/14 [redacted]w[redacted]d 00:38 / 00:02 6 lb 6.5 oz (2.905 kg) M Vag-Spont None N LIV  1 Term 05/15/13 [redacted]w[redacted]d / 00:08 5 lb 15.6 oz (2.71 kg) F Vag-Spont None N LIV     Complications: Gestational hypertension   Physical Assessment:   Vitals:   01/04/19 1348  BP: 134/88  Pulse: 100  Weight: 200 lb (90.7 kg)  Body mass index is 33.28 kg/m.       Physical Examination:  General appearance - well appearing, and in no distress  Mental status - alert, oriented to person, place, and time  Psych:  She has a normal mood and affect  Skin - warm and dry, normal color, no suspicious lesions  noted  Chest - effort normal, all lung fields clear to auscultation bilaterally  Heart - normal rate and regular rhythm  Abdomen - soft, nontender  Extremities:  No swelling or varicosities noted  Pelvic - VULVA: normal appearing vulva with no masses, tenderness or lesions  VAGINA: normal appearing vagina with normal color and discharge, no lesions  CERVIX: normal appearing cervix without discharge or lesions, no CMT  Thin prep pap is done w/ HR HPV cotesting  TODAY'S FHT: 154 via doppler  Results for orders placed or performed in visit on 01/04/19 (from the past 24 hour(s))  POC Urinalysis Dipstick OB   Collection Time: 01/04/19  2:21 PM  Result Value Ref Range   Color, UA     Clarity, UA     Glucose, UA Negative Negative   Bilirubin, UA     Ketones, UA neg    Spec Grav, UA     Blood, UA neg    pH, UA     POC,PROTEIN,UA Negative Negative, Trace, Small (1+), Moderate (2+), Large (3+), 4+   Urobilinogen, UA     Nitrite, UA neg    Leukocytes, UA Negative Negative   Appearance     Odor      Assessment & Plan:  1) Low-Risk Pregnancy [id number] at [redacted]w[redacted]d with an Estimated Date of Delivery: 07/06/19  2) Initial OB visit  3) H/O GHTN> ASA 162mg  daily, baseline labs today  Meds:  Meds ordered this encounter  Medications  . Blood Pressure Monitor MISC    Sig: For regular home bp monitoring during pregnancy    Dispense:  1 each    Refill:  0    Z34.90  . aspirin EC 81 MG tablet    Sig: Take 2 tablets (162 mg total) by mouth daily.    Dispense:  60 tablet    Refill:  8    Order Specific Question:   Supervising Provider    Answer:   [[id number]]    Initial labs obtained 12/21/18 when here for 1st IT/NT Continue prenatal vitamins Reviewed n/v relief measures and warning s/s to report Reviewed recommended weight gain based on pre-gravid BMI Encouraged well-balanced diet Genetic Screening discussed: requested nt/it, declined maternit21 Cystic fibrosis, SMA, Fragile X  screening discussed declined Ultrasound discussed; fetal survey: requested CCNC completed>PCM not here, form faxed The nature of [place] [facility] for 14/7/20 with multiple MDs and other Advanced Practice Providers was explained to patient; also emphasized that fellows, residents, and students are part of our team. Does not have home bp cuff. Rx faxed to [facility]. Check bp weekly, let [employer] know if >140/90.   Follow-up: Return in about 5 weeks (around 02/08/2019) for [name], 2nd IT, 02/10/2019, in person, CNM.   Orders Placed This Encounter  Procedures  . Urine Culture  . HF:WYOVZCH OB Comp + 14 Wk  . Flu Vaccine QUAD 36+ mos IM  . Pain Management Screening Profile (10S)  . Comprehensive metabolic panel  . Protein / creatinine ratio, urine  . POC Urinalysis Dipstick OB    [place] CNM, [facility] 01/04/2019 3:06 PM

## 2019-01-05 LAB — COMPREHENSIVE METABOLIC PANEL
ALT: 13 IU/L (ref 0–32)
AST: 16 IU/L (ref 0–40)
Albumin/Globulin Ratio: 1.4 (ref 1.2–2.2)
Albumin: 4.2 g/dL (ref 3.9–5.0)
Alkaline Phosphatase: 84 IU/L (ref 39–117)
BUN/Creatinine Ratio: 12 (ref 9–23)
BUN: 7 mg/dL (ref 6–20)
Bilirubin Total: 0.3 mg/dL (ref 0.0–1.2)
CO2: 19 mmol/L — ABNORMAL LOW (ref 20–29)
Calcium: 9.1 mg/dL (ref 8.7–10.2)
Chloride: 100 mmol/L (ref 96–106)
Creatinine, Ser: 0.57 mg/dL (ref 0.57–1.00)
GFR calc Af Amer: 151 mL/min/{1.73_m2} (ref >59)
GFR calc non Af Amer: 131 mL/min/{1.73_m2} (ref >59)
Globulin, Total: 3.1 g/dL (ref 1.5–4.5)
Glucose: 76 mg/dL (ref 65–99)
Potassium: 4 mmol/L (ref 3.5–5.2)
Sodium: 137 mmol/L (ref 134–144)
Total Protein: 7.3 g/dL (ref 6.0–8.5)

## 2019-01-05 LAB — PROTEIN / CREATININE RATIO, URINE
Creatinine, Urine: 95.5 mg/dL
Protein, Ur: 6 mg/dL
Protein/Creat Ratio: 63 mg/g creat (ref 0–200)

## 2019-01-06 LAB — PMP SCREEN PROFILE (10S), URINE
Amphetamine Scrn, Ur: NEGATIVE ng/mL
BARBITURATE SCREEN URINE: NEGATIVE ng/mL
BENZODIAZEPINE SCREEN, URINE: NEGATIVE ng/mL
CANNABINOIDS UR QL SCN: NEGATIVE ng/mL
Cocaine (Metab) Scrn, Ur: NEGATIVE ng/mL
Creatinine(Crt), U: 103.1 mg/dL (ref 20.0–300.0)
Methadone Screen, Urine: NEGATIVE ng/mL
OXYCODONE+OXYMORPHONE UR QL SCN: NEGATIVE ng/mL
Opiate Scrn, Ur: NEGATIVE ng/mL
Ph of Urine: 5.9 (ref 4.5–8.9)
Phencyclidine Qn, Ur: NEGATIVE ng/mL
Propoxyphene Scrn, Ur: NEGATIVE ng/mL

## 2019-01-06 LAB — URINE CULTURE

## 2019-01-12 ENCOUNTER — Telehealth: Payer: Self-pay | Admitting: Family Medicine

## 2019-01-12 LAB — CYTOLOGY - PAP
Chlamydia: NEGATIVE
Comment: NEGATIVE
Comment: NEGATIVE
Comment: NORMAL
Diagnosis: UNDETERMINED — AB
High risk HPV: POSITIVE — AB
Neisseria Gonorrhea: NEGATIVE

## 2019-01-12 MED ORDER — TRIAMCINOLONE ACETONIDE 0.1 % EX CREA: TOPICAL_CREAM | CUTANEOUS | 0 refills | Status: DC

## 2019-01-12 NOTE — Telephone Encounter (Signed)
Patient is requesting a prescription for triamcinolone cream called into [facility]

## 2019-01-12 NOTE — Telephone Encounter (Signed)
Prescription sent electronically to pharmacy. Left message to return call to notify patient. 

## 2019-01-12 NOTE — Telephone Encounter (Signed)
Patient notified

## 2019-01-12 NOTE — Telephone Encounter (Signed)
May use triamcinolone cream 0.1% applied twice daily thin amount as needed for eczema 45 g tube 0 refill

## 2019-01-15 NOTE — L&D Delivery Note (Addendum)
OB/GYN Faculty Practice Delivery Note  Jennifer Aguilar is a 24 y.o. [id number] s/p SVD at [redacted]w[redacted]d. She was admitted for IOL 2/2 gHTN.   ROM: 1h 71m with clear fluid GBS Status: Negative Maximum Maternal Temperature: 98.6  Labor Progress: . Initially placed a Foley bulb and given a dose of Cytotec.  Started pitocin.  AROMed at 2310.  Progressed to complete without complication.  Delivery Date/Time: 06/23/2019 at 0046 Delivery: Called to room and patient was complete and pushing. Head delivered LOA- delivered by nursing staff. No nuchal cord present. Shoulder and body delivered in usual fashion. Infant with spontaneous cry, placed on mother's abdomen, dried and stimulated. Cord clamped x 2 after 1-minute delay, and cut by patient under my direct supervision. Cord blood drawn. Placenta delivered spontaneously with gentle cord traction. Fundus firm with massage and Pitocin. Labia, perineum, vagina, and cervix were inspected, found to have bilateral labial abrasions and the right side was repaired with 4-0 Monocryl in the usual fashion with hemostasis noted.   Placenta: Intact, 3 vessel cord Complications: None Lacerations: Bilateral labial  EBL: 250 cc Analgesia: None  Postpartum Planning [x]  message to sent to schedule follow-up  [x]  vaccines UTD  Infant: Viable female  APGARs 8/9  weight per medical record   EMILY , MD PGY-2 Resident, Family Medicine 06/23/2019, 1:13 AM  GME ATTESTATION:  I saw and evaluated the patient. Baby was out- delivered by nursing staff prior to my entry into the room. I was gloved and supervised the resident during the delivery of the placenta and the repair. I agree with the findings and the plan of care as documented in the resident's note.  Genene Churn, DO OB Fellow, Faculty [facility], Center for [facility] 06/23/2019 1:43 AM

## 2019-02-08 ENCOUNTER — Encounter: Payer: Self-pay | Admitting: Advanced Practice Midwife

## 2019-02-08 ENCOUNTER — Other Ambulatory Visit: Payer: Self-pay

## 2019-02-08 ENCOUNTER — Ambulatory Visit (INDEPENDENT_AMBULATORY_CARE_PROVIDER_SITE_OTHER): Payer: Medicaid Other | Admitting: Advanced Practice Midwife

## 2019-02-08 ENCOUNTER — Ambulatory Visit (INDEPENDENT_AMBULATORY_CARE_PROVIDER_SITE_OTHER): Payer: Medicaid Other

## 2019-02-08 VITALS — BP 117/71 | HR 100 | Wt 205.0 lb

## 2019-02-08 DIAGNOSIS — Z3A18 18 weeks gestation of pregnancy: Secondary | ICD-10-CM | POA: Diagnosis not present

## 2019-02-08 DIAGNOSIS — Z3481 Encounter for supervision of other normal pregnancy, first trimester: Secondary | ICD-10-CM

## 2019-02-08 DIAGNOSIS — Z363 Encounter for antenatal screening for malformations: Secondary | ICD-10-CM

## 2019-02-08 DIAGNOSIS — Z331 Pregnant state, incidental: Secondary | ICD-10-CM

## 2019-02-08 DIAGNOSIS — Z1389 Encounter for screening for other disorder: Secondary | ICD-10-CM

## 2019-02-08 DIAGNOSIS — Z31430 Encounter of female for testing for genetic disease carrier status for procreative management: Secondary | ICD-10-CM

## 2019-02-08 DIAGNOSIS — Z3482 Encounter for supervision of other normal pregnancy, second trimester: Secondary | ICD-10-CM

## 2019-02-08 LAB — POCT URINALYSIS DIPSTICK OB
Blood, UA: NEGATIVE
Glucose, UA: NEGATIVE
Ketones, UA: NEGATIVE
Leukocytes, UA: NEGATIVE
Nitrite, UA: NEGATIVE
POC,PROTEIN,UA: NEGATIVE

## 2019-02-08 NOTE — Progress Notes (Signed)
   LOW-RISK PREGNANCY VISIT Patient name: Jennifer Aguilar MRN [id number]  Date of birth: [birth year] Chief Complaint:   Routine Prenatal Visit (anatomy scan, pn2)  History of Present Illness:   Jennifer Aguilar is a 24 y.o. [phone] female at [redacted]w[redacted]d with an Estimated Date of Delivery: 07/06/19 being seen today for ongoing management of a low-risk pregnancy.  Today she reports no complaints. Contractions: Not present. Vag. Bleeding: None.  Movement: Present. denies leaking of fluid. Review of Systems:   Pertinent items are noted in HPI Denies abnormal vaginal discharge w/ itching/odor/irritation, headaches, visual changes, shortness of breath, chest pain, abdominal pain, severe nausea/vomiting, or problems with urination or bowel movements unless otherwise stated above. Pertinent History Reviewed:  Reviewed past medical,surgical, social, obstetrical and family history.  Reviewed problem list, medications and allergies. Physical Assessment:   Vitals:   02/08/19 1412  BP: 117/71  Pulse: 100  Weight: 205 lb (93 kg)  Body mass index is 34.11 kg/m.        Physical Examination:   General appearance: Well appearing, and in no distress  Mental status: Alert, oriented to person, place, and time  Skin: Warm & dry  Cardiovascular: Normal heart rate noted  Respiratory: Normal respiratory effort, no distress  Abdomen: Soft, gravid, nontender  Pelvic: Cervical exam deferred         Extremities: Edema: None  Fetal Status: Fetal Heart Rate (bpm): 144us   Movement: Present   [place] 18+6 wks,breech,posterior placenta gr 0,normal ovaries,cx 3 cm,fhr 144 bpm,svp of fluid 4.6 cm,efw 307 g 88%,anatomy complete,no obvious abnormalities   Chaperone: n/a    Results for orders placed or performed in visit on 02/08/19 (from the past 24 hour(s))  POC Urinalysis Dipstick OB   Collection Time: 02/08/19  2:14 PM  Result Value Ref Range   Color, UA     Clarity, UA     Glucose, UA Negative Negative   Bilirubin, UA     Ketones, UA neg    Spec Grav, UA     Blood, UA neg    pH, UA     POC,PROTEIN,UA Negative Negative, Trace, Small (1+), Moderate (2+), Large (3+), 4+   Urobilinogen, UA     Nitrite, UA neg    Leukocytes, UA Negative Negative   Appearance     Odor      Assessment & Plan:  1) Low-risk pregnancy G4P3003 at [redacted]w[redacted]d with an Estimated Date of Delivery: 07/06/19   2) Wants BTL:  Discussed regret, offered IUD.  Will consider  Meds: No orders of the defined types were placed in this encounter.  Labs/procedures today: anatomy scan, 2nd IT  Plan:  Continue routine obstetrical care  Next visit: prefers online    Reviewed: Preterm labor symptoms and general obstetric precautions including but not limited to vaginal bleeding, contractions, leaking of fluid and fetal movement were reviewed in detail with the patient.  All questions were answered. Hasn't gotten her home bp cuff. Rx faxed to [facility] on 12/21  Will recheck and send it again prn. Check bp weekly, let us know if >140/90.   Follow-up: No follow-ups on file.  Orders Placed This Encounter  Procedures  . INTEGRATED 2  . POC Urinalysis Dipstick OB   Jacklyn Shell DNP, CNM 02/08/2019 2:27 PM

## 2019-02-08 NOTE — Patient Instructions (Signed)
Jennifer Aguilar, I greatly value your feedback.  If you receive a survey following your visit with [place] today, we appreciate you taking the time to fill it out.  Thanks, [name], CNM     [facility] HAS MOVED!!! It is now [facility] at [facility] ([address] [place], [place] [address]) Entrance located off of [address] Free 24/7 valet parking   Go to [email or web address] to register for FREE online childbirth classes    Second Trimester of Pregnancy The second trimester is from week 14 through week 27 (months 4 through 6). The second trimester is often a time when you feel your best. Your body has adjusted to being pregnant, and you begin to feel better physically. Usually, morning sickness has lessened or quit completely, you may have more energy, and you may have an increase in appetite. The second trimester is also a time when the fetus is growing rapidly. At the end of the sixth month, the fetus is about 9 inches long and weighs about 1 pounds. You will likely begin to feel the baby move (quickening) between 16 and 20 weeks of pregnancy. Body changes during your second trimester Your body continues to go through many changes during your second trimester. The changes vary from woman to woman.  Your weight will continue to increase. You will notice your lower abdomen bulging out.  You may begin to get stretch marks on your hips, abdomen, and breasts.  You may develop headaches that can be relieved by medicines. The medicines should be approved by your health care provider.  You may urinate more often because the fetus is pressing on your bladder.  You may develop or continue to have heartburn as a result of your pregnancy.  You may develop constipation because certain hormones are causing the muscles that push waste through your intestines to slow down.  You may develop hemorrhoids or swollen, bulging veins (varicose veins).  You may have back  pain. This is caused by: ? Weight gain. ? Pregnancy hormones that are relaxing the joints in your pelvis. ? A shift in weight and the muscles that support your balance.  Your breasts will continue to grow and they will continue to become tender.  Your gums may bleed and may be sensitive to brushing and flossing.  Dark spots or blotches (chloasma, mask of pregnancy) may develop on your face. This will likely fade after the baby is born.  A dark line from your belly button to the pubic area (linea nigra) may appear. This will likely fade after the baby is born.  You may have changes in your hair. These can include thickening of your hair, rapid growth, and changes in texture. Some women also have hair loss during or after pregnancy, or hair that feels dry or thin. Your hair will most likely return to normal after your baby is born.  What to expect at prenatal visits During a routine prenatal visit:  You will be weighed to make sure you and the fetus are growing normally.  Your blood pressure will be taken.  Your abdomen will be measured to track your baby's growth.  The fetal heartbeat will be listened to.  Any test results from the previous visit will be discussed.  Your health care provider may ask you:  How you are feeling.  If you are feeling the baby move.  If you have had any abnormal symptoms, such as leaking fluid, bleeding, severe headaches, or  abdominal cramping.  If you are using any tobacco products, including cigarettes, chewing tobacco, and electronic cigarettes.  If you have any questions.  Other tests that may be performed during your second trimester include:  Blood tests that check for: ? Low iron levels (anemia). ? High blood sugar that affects pregnant women (gestational diabetes) between 10 and 28 weeks. ? Rh antibodies. This is to check for a protein on red blood cells (Rh factor).  Urine tests to check for infections, diabetes, or protein in the  urine.  An ultrasound to confirm the proper growth and development of the baby.  An amniocentesis to check for possible genetic problems.  Fetal screens for spina bifida and Down syndrome.  HIV (human immunodeficiency virus) testing. Routine prenatal testing includes screening for HIV, unless you choose not to have this test.  Follow these instructions at home: Medicines  Follow your health care provider's instructions regarding medicine use. Specific medicines may be either safe or unsafe to take during pregnancy.  Take a prenatal vitamin that contains at least 600 micrograms (mcg) of folic acid.  If you develop constipation, try taking a stool softener if your health care provider approves. Eating and drinking  Eat a balanced diet that includes fresh fruits and vegetables, whole grains, good sources of protein such as meat, eggs, or tofu, and low-fat dairy. Your health care provider will help you determine the amount of weight gain that is right for you.  Avoid raw meat and uncooked cheese. These carry germs that can cause birth defects in the baby.  If you have low calcium intake from food, talk to your health care provider about whether you should take a daily calcium supplement.  Limit foods that are high in fat and processed sugars, such as fried and sweet foods.  To prevent constipation: ? Drink enough fluid to keep your urine clear or pale yellow. ? Eat foods that are high in fiber, such as fresh fruits and vegetables, whole grains, and beans. Activity  Exercise only as directed by your health care provider. Most women can continue their usual exercise routine during pregnancy. Try to exercise for 30 minutes at least 5 days a week. Stop exercising if you experience uterine contractions.  Avoid heavy lifting, wear low heel shoes, and practice good posture.  A sexual relationship may be continued unless your health care provider directs you otherwise. Relieving pain and  discomfort  Wear a good support bra to prevent discomfort from breast tenderness.  Take warm sitz baths to soothe any pain or discomfort caused by hemorrhoids. Use hemorrhoid cream if your health care provider approves.  Rest with your legs elevated if you have leg cramps or low back pain.  If you develop varicose veins, wear support hose. Elevate your feet for 15 minutes, 3-4 times a day. Limit salt in your diet. Prenatal Care  Write down your questions. Take them to your prenatal visits.  Keep all your prenatal visits as told by your health care provider. This is important. Safety  Wear your seat belt at all times when driving.  Make a list of emergency phone numbers, including numbers for family, friends, the hospital, and police and fire departments. General instructions  Ask your health care provider for a referral to a local prenatal education class. Begin classes no later than the beginning of month 6 of your pregnancy.  Ask for help if you have counseling or nutritional needs during pregnancy. Your health care provider can offer advice or  refer you to specialists for help with various needs.  Do not use hot tubs, steam rooms, or saunas.  Do not douche or use tampons or scented sanitary pads.  Do not cross your legs for long periods of time.  Avoid cat litter boxes and soil used by cats. These carry germs that can cause birth defects in the baby and possibly loss of the fetus by miscarriage or stillbirth.  Avoid all smoking, herbs, alcohol, and unprescribed drugs. Chemicals in these products can affect the formation and growth of the baby.  Do not use any products that contain nicotine or tobacco, such as cigarettes and e-cigarettes. If you need help quitting, ask your health care provider.  Visit your dentist if you have not gone yet during your pregnancy. Use a soft toothbrush to brush your teeth and be gentle when you floss. Contact a health care provider if:  You  have dizziness.  You have mild pelvic cramps, pelvic pressure, or nagging pain in the abdominal area.  You have persistent nausea, vomiting, or diarrhea.  You have a bad smelling vaginal discharge.  You have pain when you urinate. Get help right away if:  You have a fever.  You are leaking fluid from your vagina.  You have spotting or bleeding from your vagina.  You have severe abdominal cramping or pain.  You have rapid weight gain or weight loss.  You have shortness of breath with chest pain.  You notice sudden or extreme swelling of your face, hands, ankles, feet, or legs.  You have not felt your baby move in over an hour.  You have severe headaches that do not go away when you take medicine.  You have vision changes. Summary  The second trimester is from week 14 through week 27 (months 4 through 6). It is also a time when the fetus is growing rapidly.  Your body goes through many changes during pregnancy. The changes vary from woman to woman.  Avoid all smoking, herbs, alcohol, and unprescribed drugs. These chemicals affect the formation and growth your baby.  Do not use any tobacco products, such as cigarettes, chewing tobacco, and e-cigarettes. If you need help quitting, ask your health care provider.  Contact your health care provider if you have any questions. Keep all prenatal visits as told by your health care provider. This is important. This information is not intended to replace advice given to you by your health care provider. Make sure you discuss any questions you have with your health care provider.

## 2019-02-08 NOTE — Progress Notes (Signed)
[place] 18+6 wks,breech,posterior placenta gr 0,normal ovaries,cx 3 cm,fhr 144 bpm,svp of fluid 4.6 cm,efw 307 g 88%,anatomy complete,no obvious abnormalities

## 2019-02-10 LAB — INTEGRATED 2
AFP MoM: 1.64
Alpha-Fetoprotein: 65.7 ng/mL
Crown Rump Length: 62.8 mm
DIA MoM: 0.96
DIA Value: 146.2 pg/mL
Estriol, Unconjugated: 1.63 ng/mL
Gest. Age on Collection Date: 12.6 weeks
Gestational Age: 19.6 weeks
Maternal Age at EDD: 24.4 yr
Nuchal Translucency (NT): 1.8 mm
Nuchal Translucency MoM: 1.28
Number of Fetuses: 1
PAPP-A MoM: 1.23
PAPP-A Value: 829.6 ng/mL
Test Results:: NEGATIVE
Weight: 199 [lb_av]
Weight: 205 [lb_av]
hCG MoM: 1.58
hCG Value: 28.4 IU/mL
uE3 MoM: 0.87

## 2019-02-12 DIAGNOSIS — Z3490 Encounter for supervision of normal pregnancy, unspecified, unspecified trimester: Secondary | ICD-10-CM | POA: Diagnosis not present

## 2019-02-21 ENCOUNTER — Other Ambulatory Visit: Payer: Self-pay | Admitting: Family Medicine

## 2019-03-01 ENCOUNTER — Other Ambulatory Visit: Payer: Self-pay | Admitting: Family Medicine

## 2019-03-04 ENCOUNTER — Other Ambulatory Visit: Payer: Self-pay | Admitting: Family Medicine

## 2019-03-04 ENCOUNTER — Encounter: Payer: Self-pay | Admitting: Family Medicine

## 2019-03-05 ENCOUNTER — Ambulatory Visit (INDEPENDENT_AMBULATORY_CARE_PROVIDER_SITE_OTHER): Payer: Medicaid Other | Admitting: Family Medicine

## 2019-03-05 DIAGNOSIS — R21 Rash and other nonspecific skin eruption: Secondary | ICD-10-CM | POA: Diagnosis not present

## 2019-03-05 NOTE — Progress Notes (Signed)
   Subjective:    Patient ID: Jennifer Aguilar, female    DOB: 1995-02-11, 24 y.o.   MRN: [id number]  Rash This is a new problem. Episode onset: 2 days. Location: face. The rash is characterized by redness. She was exposed to nothing. Treatments tried: calamine lotion and cortisone cream.  pt sent a picture of rash through mychart this morning.   Virtual Visit via Telephone Note  I connected with Jennifer Aguilar on 03/05/19 at  2:30 PM EST by telephone and verified that I am speaking with the correct person using two identifiers.  Location: Patient: home Provider: other   I discussed the limitations, risks, security and privacy concerns of performing an evaluation and management service by telephone and the availability of in person appointments. I also discussed with the patient that there may be a patient responsible charge related to this service. The patient expressed understanding and agreed to proceed.   History of Present Illness:    Observations/Objective:   Assessment and Plan:   Follow Up Instructions:    I discussed the assessment and treatment plan with the patient. The patient was provided an opportunity to ask questions and all were answered. The patient agreed with the plan and demonstrated an understanding of the instructions.   The patient was advised to call back or seek an in-person evaluation if the symptoms worsen or if the condition fails to improve as anticipated.  I provided 30 minutes of non-face-to-face time during this encounter.  Patient sent rash.  See chart.  See below physical for description  3 days duration.  No new exposures.  Has never had anything like this before.  Not particularly itchy.  No associated chest pain achiness joint pain abdominal discomfort headaches etc.  Patient is [redacted] weeks pregnant  No personal history of lupus  No family history of lupus    Review of Systems  Skin: Positive for rash.       Objective:   Physical Exam  Impressive flushed malar rash.  Pictures reveal a classic butterfly pattern plus chin involvement  Of note search of literature of lupus rash does show that often can flareup in the second trimester of pregnancy, and though generally not involving the chin and certainly can involve      Assessment & Plan:  Impression potential lupus flare/presentation.  Potentially just dermatic at this point at the end of second trimester.  Does not look like chloasma.  May be a false concern but due to significance of lupus during pregnancy feel further evaluation is necessary.  I was considering doing blood work, however, frankly I have never worked this up in a pregnant patient before and I defer to the patient's OB/GYN to decide further testing.  Patient states she has seen Dr. Christin Bach on Monday I will send a message.

## 2019-03-08 ENCOUNTER — Other Ambulatory Visit: Payer: Self-pay

## 2019-03-08 ENCOUNTER — Encounter: Payer: Self-pay | Admitting: Obstetrics and Gynecology

## 2019-03-08 ENCOUNTER — Ambulatory Visit (INDEPENDENT_AMBULATORY_CARE_PROVIDER_SITE_OTHER): Payer: Medicaid Other | Admitting: Obstetrics and Gynecology

## 2019-03-08 VITALS — BP 127/81 | HR 102 | Wt 208.8 lb

## 2019-03-08 DIAGNOSIS — Z3482 Encounter for supervision of other normal pregnancy, second trimester: Secondary | ICD-10-CM

## 2019-03-08 DIAGNOSIS — Z1389 Encounter for screening for other disorder: Secondary | ICD-10-CM

## 2019-03-08 DIAGNOSIS — Z3A22 22 weeks gestation of pregnancy: Secondary | ICD-10-CM

## 2019-03-08 DIAGNOSIS — Z331 Pregnant state, incidental: Secondary | ICD-10-CM

## 2019-03-08 LAB — POCT URINALYSIS DIPSTICK OB
Blood, UA: NEGATIVE
Glucose, UA: NEGATIVE
Ketones, UA: NEGATIVE
Nitrite, UA: NEGATIVE
POC,PROTEIN,UA: NEGATIVE

## 2019-03-08 NOTE — Progress Notes (Signed)
Patient ID: Jennifer Aguilar, female   DOB: 08/30/1995, 24 y.o.   MRN: [id number]    LOW-RISK PREGNANCY VISIT Patient name: Jennifer Aguilar MRN [id number]  Date of birth: Mar 15, 1995 Chief Complaint:   Routine Prenatal Visit (rash on face)  History of Present Illness:   [name] is a 24 y.o. [phone] female at [redacted]w[redacted]d with an Estimated Date of Delivery: 07/06/19 being seen today for ongoing management of a low-risk pregnancy.  Today she reports rash on right cheek. She states that it began as a bump five days ago and has now spread across her face and is peeling. She states that she has been using Hydrocortisone cream with relief. Of note, she reports a history of diffuse eczema. . Contractions: Not present. Vag. Bleeding: None.  Movement: Present. denies leaking of fluid and fever. Review of Systems:   Pertinent items are noted in HPI Denies abnormal vaginal discharge w/ itching/odor/irritation, headaches, visual changes, shortness of breath, chest pain, abdominal pain, severe nausea/vomiting, or problems with urination or bowel movements unless otherwise stated above. Pertinent History Reviewed:  Reviewed past medical,surgical, social, obstetrical and family history.  Reviewed problem list, medications and allergies. Physical Assessment:   Vitals:   03/08/19 1342  BP: 127/81  Pulse: (!) 102  Weight: 208 lb 12.8 oz (94.7 kg)  Body mass index is 34.75 kg/m.        Physical Examination:   General appearance: Well appearing, and in no distress  Mental status: Alert, oriented to person, place, and time  Skin: Warm & dry  Cardiovascular: Normal heart rate noted  Respiratory: Normal respiratory effort, no distress  Abdomen: Soft, gravid, nontender  Pelvic: Cervical exam deferred         Extremities: Edema: None  Fetal Status: Fetal Heart Rate (bpm): 145   Movement: Present    Chaperone: Clerance Lav    Results for orders placed or performed in visit on 03/08/19 (from the past 24 hour(s))    POC Urinalysis Dipstick OB   Collection Time: 03/08/19  1:43 PM  Result Value Ref Range   Color, UA     Clarity, UA     Glucose, UA Negative Negative   Bilirubin, UA     Ketones, UA neg    Spec Grav, UA     Blood, UA neg    pH, UA     POC,PROTEIN,UA Negative Negative, Trace, Small (1+), Moderate (2+), Large (3+), 4+   Urobilinogen, UA     Nitrite, UA neg    Leukocytes, UA Trace (A) Negative   Appearance     Odor      Assessment & Plan:  1) Low-risk pregnancy G4P3003 at [redacted]w[redacted]d with an Estimated Date of Delivery: 07/06/19   2) Malar Facial rash, healing nicely( hx eczema)   Meds: No orders of the defined types were placed in this encounter.  Labs/procedures today: None  Plan: Continue routine obstetrical care and using Hydrocortisone cream as needed.  Next visit: in person   Reviewed: Preterm labor symptoms and general obstetric precautions including but not limited to vaginal bleeding, contractions, leaking of fluid and fetal movement were reviewed in detail with the patient.  All questions were answered.  Follow-up: Return in about 4 weeks (around 04/05/2019) for PN2 (glucose).  Orders Placed This Encounter  Procedures  . POC Urinalysis Dipstick OB   By signing my name below, I, Clerance Lav, attest that this documentation has been prepared under the direction and in the presence of  Tilda Burrow, MD. Electronically Signed: Nikki Dom, Medical Scribe. 03/08/19. 2:06 PM.  I personally performed the services described in this documentation, which was SCRIBED in my presence. The recorded information has been reviewed and considered accurate. It has been edited as necessary during review. Tilda Burrow, MD

## 2019-04-05 ENCOUNTER — Other Ambulatory Visit: Payer: Medicaid Other

## 2019-04-05 ENCOUNTER — Encounter: Payer: Self-pay | Admitting: Advanced Practice Midwife

## 2019-04-05 ENCOUNTER — Ambulatory Visit (INDEPENDENT_AMBULATORY_CARE_PROVIDER_SITE_OTHER): Payer: Medicaid Other | Admitting: Advanced Practice Midwife

## 2019-04-05 ENCOUNTER — Other Ambulatory Visit: Payer: Self-pay

## 2019-04-05 VITALS — BP 123/72 | HR 97 | Wt 215.0 lb

## 2019-04-05 DIAGNOSIS — Z3482 Encounter for supervision of other normal pregnancy, second trimester: Secondary | ICD-10-CM

## 2019-04-05 DIAGNOSIS — Z3A26 26 weeks gestation of pregnancy: Secondary | ICD-10-CM

## 2019-04-05 DIAGNOSIS — Z1389 Encounter for screening for other disorder: Secondary | ICD-10-CM

## 2019-04-05 DIAGNOSIS — Z331 Pregnant state, incidental: Secondary | ICD-10-CM

## 2019-04-05 DIAGNOSIS — Z131 Encounter for screening for diabetes mellitus: Secondary | ICD-10-CM | POA: Diagnosis not present

## 2019-04-05 LAB — POCT URINALYSIS DIPSTICK OB
Blood, UA: NEGATIVE
Glucose, UA: NEGATIVE
Ketones, UA: NEGATIVE
Nitrite, UA: NEGATIVE
POC,PROTEIN,UA: NEGATIVE

## 2019-04-05 NOTE — Patient Instructions (Signed)
Jennifer Aguilar, I greatly value your feedback.  If you receive a survey following your visit with [place] today, we appreciate you taking the time to fill it out.  Thanks, [name], CNM   [facility] HAS MOVED!!! It is now [facility] ([address] [place], [place] [address]) Entrance located off of [address] Free 24/7 valet parking   Go to [email or web address] to register for FREE online childbirth classes    Call the office [phone]) or go to [facility] if:  You begin to have strong, frequent contractions  Your water breaks.  Sometimes it is a big gush of fluid, sometimes it is just a trickle that keeps getting your panties wet or running down your legs  You have vaginal bleeding.  It is normal to have a small amount of spotting if your cervix was checked.   You don't feel your baby moving like normal.  If you don't, get you something to eat and drink and lay down and focus on feeling your baby move.  You should feel at least 10 movements in 2 hours.  If you don't, you should call the office or go to [facility].    Tdap Vaccine  It is recommended that you get the Tdap vaccine during the third trimester of EACH pregnancy to help protect your baby from getting pertussis (whooping cough)  27-36 weeks is the BEST time to do this so that you can pass the protection on to your baby. During pregnancy is better than after pregnancy, but if you are unable to get it during pregnancy it will be offered at the hospital.   You will be offered this vaccine in the office after 27 weeks. If you do not have health insurance, you can get this vaccine at the health department or your family doctor  Everyone who will be around your baby should also be up-to-date on their vaccines. Adults (who are not pregnant) only need 1 dose of Tdap during adulthood.   Third Trimester of Pregnancy The third trimester is from week 29 through week 42, months 7 through 9. The  third trimester is a time when the fetus is growing rapidly. At the end of the ninth month, the fetus is about 20 inches in length and weighs 6-10 pounds.  BODY CHANGES Your body goes through many changes during pregnancy. The changes vary from woman to woman.   Your weight will continue to increase. You can expect to gain 25-35 pounds (11-16 kg) by the end of the pregnancy.  You may begin to get stretch marks on your hips, abdomen, and breasts.  You may urinate more often because the fetus is moving lower into your pelvis and pressing on your bladder.  You may develop or continue to have heartburn as a result of your pregnancy.  You may develop constipation because certain hormones are causing the muscles that push waste through your intestines to slow down.  You may develop hemorrhoids or swollen, bulging veins (varicose veins).  You may have pelvic pain because of the weight gain and pregnancy hormones relaxing your joints between the bones in your pelvis. Backaches may result from overexertion of the muscles supporting your posture.  You may have changes in your hair. These can include thickening of your hair, rapid growth, and changes in texture. Some women also have hair loss during or after pregnancy, or hair that feels dry or thin. Your hair will most likely return to  normal after your baby is born.  Your breasts will continue to grow and be tender. A yellow discharge may leak from your breasts called colostrum.  Your belly button may stick out.  You may feel short of breath because of your expanding uterus.  You may notice the fetus "dropping," or moving lower in your abdomen.  You may have a bloody mucus discharge. This usually occurs a few days to a week before labor begins.  Your cervix becomes thin and soft (effaced) near your due date. WHAT TO EXPECT AT YOUR PRENATAL EXAMS  You will have prenatal exams every 2 weeks until week 36. Then, you will have weekly prenatal  exams. During a routine prenatal visit:  You will be weighed to make sure you and the fetus are growing normally.  Your blood pressure is taken.  Your abdomen will be measured to track your baby's growth.  The fetal heartbeat will be listened to.  Any test results from the previous visit will be discussed.  You may have a cervical check near your due date to see if you have effaced. At around 36 weeks, your caregiver will check your cervix. At the same time, your caregiver will also perform a test on the secretions of the vaginal tissue. This test is to determine if a type of bacteria, Group B streptococcus, is present. Your caregiver will explain this further. Your caregiver may ask you:  What your birth plan is.  How you are feeling.  If you are feeling the baby move.  If you have had any abnormal symptoms, such as leaking fluid, bleeding, severe headaches, or abdominal cramping.  If you have any questions. Other tests or screenings that may be performed during your third trimester include:  Blood tests that check for low iron levels (anemia).  Fetal testing to check the health, activity level, and growth of the fetus. Testing is done if you have certain medical conditions or if there are problems during the pregnancy. FALSE LABOR You may feel small, irregular contractions that eventually go away. These are called Braxton Hicks contractions, or false labor. Contractions may last for hours, days, or even weeks before true labor sets in. If contractions come at regular intervals, intensify, or become painful, it is best to be seen by your caregiver.  SIGNS OF LABOR   Menstrual-like cramps.  Contractions that are 5 minutes apart or less.  Contractions that start on the top of the uterus and spread down to the lower abdomen and back.  A sense of increased pelvic pressure or back pain.  A watery or bloody mucus discharge that comes from the vagina. If you have any of these  signs before the 37th week of pregnancy, call your caregiver right away. You need to go to the hospital to get checked immediately. HOME CARE INSTRUCTIONS   Avoid all smoking, herbs, alcohol, and unprescribed drugs. These chemicals affect the formation and growth of the baby.  Follow your caregiver's instructions regarding medicine use. There are medicines that are either safe or unsafe to take during pregnancy.  Exercise only as directed by your caregiver. Experiencing uterine cramps is a good sign to stop exercising.  Continue to eat regular, healthy meals.  Wear a good support bra for breast tenderness.  Do not use hot tubs, steam rooms, or saunas.  Wear your seat belt at all times when driving.  Avoid raw meat, uncooked cheese, cat litter boxes, and soil used by cats. These carry germs that can  cause birth defects in the baby.  Take your prenatal vitamins.  Try taking a stool softener (if your caregiver approves) if you develop constipation. Eat more high-fiber foods, such as fresh vegetables or fruit and whole grains. Drink plenty of fluids to keep your urine clear or pale yellow.  Take warm sitz baths to soothe any pain or discomfort caused by hemorrhoids. Use hemorrhoid cream if your caregiver approves.  If you develop varicose veins, wear support hose. Elevate your feet for 15 minutes, 3-4 times a day. Limit salt in your diet.  Avoid heavy lifting, wear low heal shoes, and practice good posture.  Rest a lot with your legs elevated if you have leg cramps or low back pain.  Visit your dentist if you have not gone during your pregnancy. Use a soft toothbrush to brush your teeth and be gentle when you floss.  A sexual relationship may be continued unless your caregiver directs you otherwise.  Do not travel far distances unless it is absolutely necessary and only with the approval of your caregiver.  Take prenatal classes to understand, practice, and ask questions about the  labor and delivery.  Make a trial run to the hospital.  Pack your hospital bag.  Prepare the baby's nursery.  Continue to go to all your prenatal visits as directed by your caregiver. SEEK MEDICAL CARE IF:  You are unsure if you are in labor or if your water has broken.  You have dizziness.  You have mild pelvic cramps, pelvic pressure, or nagging pain in your abdominal area.  You have persistent nausea, vomiting, or diarrhea.  You have a bad smelling vaginal discharge.  You have pain with urination. SEEK IMMEDIATE MEDICAL CARE IF:   You have a fever.  You are leaking fluid from your vagina.  You have spotting or bleeding from your vagina.  You have severe abdominal cramping or pain.  You have rapid weight loss or gain.  You have shortness of breath with chest pain.  You notice sudden or extreme swelling of your face, hands, ankles, feet, or legs.  You have not felt your baby move in over an hour.  You have severe headaches that do not go away with medicine.  You have vision changes. Document Released: 12/25/2000 Document Revised: 01/05/2013 Document Reviewed: 03/03/2012 [facility] Patient Information 2015 [place], [place]. This information is not intended to replace advice given to you by your health care provider. Make sure you discuss any questions you have with your health care provider.

## 2019-04-05 NOTE — Progress Notes (Signed)
   LOW-RISK PREGNANCY VISIT Patient name: Jennifer Aguilar MRN [id number]  Date of birth: Jan 19, 1995 Chief Complaint:   Routine Prenatal Visit (PN2)  History of Present Illness:   Jennifer Aguilar is a 24 y.o. [phone] female at [redacted]w[redacted]d with an Estimated Date of Delivery: 07/06/19 being seen today for ongoing management of a low-risk pregnancy.  Today she reports some pelvic pressure . Contractions: Not present. Vag. Bleeding: None.  Movement: Present. denies leaking of fluid. Review of Systems:   Pertinent items are noted in HPI Denies abnormal vaginal discharge w/ itching/odor/irritation, headaches, visual changes, shortness of breath, chest pain, abdominal pain, severe nausea/vomiting, or problems with urination or bowel movements unless otherwise stated above. Pertinent History Reviewed:  Reviewed past medical,surgical, social, obstetrical and family history.  Reviewed problem list, medications and allergies. Physical Assessment:   Vitals:   04/05/19 0932  BP: 123/72  Pulse: 97  Weight: 215 lb (97.5 kg)  Body mass index is 35.78 kg/m.        Physical Examination:   General appearance: Well appearing, and in no distress  Mental status: Alert, oriented to person, place, and time  Skin: Warm & dry  Cardiovascular: Normal heart rate noted  Respiratory: Normal respiratory effort, no distress  Abdomen: Soft, gravid, nontender  Pelvic: Cervical exam deferred         Extremities: Edema: Trace  Fetal Status: Fetal Heart Rate (bpm): 156 Fundal Height: 28 cm Movement: Present    Chaperone: n/a    Results for orders placed or performed in visit on 04/05/19 (from the past 24 hour(s))  POC Urinalysis Dipstick OB   Collection Time: 04/05/19  9:30 AM  Result Value Ref Range   Color, UA     Clarity, UA     Glucose, UA Negative Negative   Bilirubin, UA     Ketones, UA neg    Spec Grav, UA     Blood, UA neg    pH, UA     POC,PROTEIN,UA Negative Negative, Trace, Small (1+), Moderate (2+),  Large (3+), 4+   Urobilinogen, UA     Nitrite, UA neg    Leukocytes, UA Moderate (2+) (A) Negative   Appearance     Odor      Assessment & Plan:  1) Low-risk pregnancy [id number] at [redacted]w[redacted]d with an Estimated Date of Delivery: 07/06/19     Meds: No orders of the defined types were placed in this encounter.  Labs/procedures today: PN2  Plan:  Continue routine obstetrical care  Next visit: prefers online    Reviewed: Preterm labor symptoms and general obstetric precautions including but not limited to vaginal bleeding, contractions, leaking of fluid and fetal movement were reviewed in detail with the patient.  All questions were answered. Has  home bp cuff. Check bp weekly, let us know if >140/90.   Follow-up: Return in about 3 weeks (around 04/26/2019) for [facility] Mychart visit.  Orders Placed This Encounter  Procedures  . POC Urinalysis Dipstick OB   Jacklyn Shell DNP, CNM 04/05/2019 10:07 AM

## 2019-04-06 LAB — HIV ANTIBODY (ROUTINE TESTING W REFLEX): HIV Screen 4th Generation wRfx: NONREACTIVE

## 2019-04-06 LAB — CBC
Hematocrit: 38.6 % (ref 34.0–46.6)
Hemoglobin: 13.1 g/dL (ref 11.1–15.9)
MCH: 30.7 pg (ref 26.6–33.0)
MCHC: 33.9 g/dL (ref 31.5–35.7)
MCV: 90 fL (ref 79–97)
Platelets: 286 10*3/uL (ref 150–450)
RBC: 4.27 x10E6/uL (ref 3.77–5.28)
RDW: 12.7 % (ref 11.7–15.4)
WBC: 10.3 10*3/uL (ref 3.4–10.8)

## 2019-04-06 LAB — GLUCOSE TOLERANCE, 2 HOURS W/ 1HR
Glucose, 1 hour: 86 mg/dL (ref 65–179)
Glucose, 2 hour: 88 mg/dL (ref 65–152)
Glucose, Fasting: 79 mg/dL (ref 65–91)

## 2019-04-06 LAB — RPR: RPR Ser Ql: NONREACTIVE

## 2019-04-06 LAB — ANTIBODY SCREEN: Antibody Screen: NEGATIVE

## 2019-04-26 ENCOUNTER — Telehealth: Payer: Medicaid Other | Admitting: Obstetrics & Gynecology

## 2019-04-29 ENCOUNTER — Telehealth: Payer: Medicaid Other | Admitting: Obstetrics & Gynecology

## 2019-05-03 ENCOUNTER — Telehealth: Payer: Medicaid Other | Admitting: Obstetrics & Gynecology

## 2019-05-10 ENCOUNTER — Ambulatory Visit (INDEPENDENT_AMBULATORY_CARE_PROVIDER_SITE_OTHER): Payer: Medicaid Other | Admitting: Obstetrics & Gynecology

## 2019-05-10 ENCOUNTER — Encounter: Payer: Self-pay | Admitting: Obstetrics & Gynecology

## 2019-05-10 ENCOUNTER — Other Ambulatory Visit: Payer: Self-pay

## 2019-05-10 VITALS — BP 116/77 | HR 97 | Wt 217.0 lb

## 2019-05-10 DIAGNOSIS — Z6791 Unspecified blood type, Rh negative: Secondary | ICD-10-CM

## 2019-05-10 DIAGNOSIS — Z3A31 31 weeks gestation of pregnancy: Secondary | ICD-10-CM

## 2019-05-10 DIAGNOSIS — O360930 Maternal care for other rhesus isoimmunization, third trimester, not applicable or unspecified: Secondary | ICD-10-CM

## 2019-05-10 DIAGNOSIS — O26893 Other specified pregnancy related conditions, third trimester: Secondary | ICD-10-CM

## 2019-05-10 DIAGNOSIS — Z331 Pregnant state, incidental: Secondary | ICD-10-CM

## 2019-05-10 DIAGNOSIS — Z23 Encounter for immunization: Secondary | ICD-10-CM | POA: Diagnosis not present

## 2019-05-10 DIAGNOSIS — Z3483 Encounter for supervision of other normal pregnancy, third trimester: Secondary | ICD-10-CM

## 2019-05-10 DIAGNOSIS — Z1389 Encounter for screening for other disorder: Secondary | ICD-10-CM

## 2019-05-10 LAB — POCT URINALYSIS DIPSTICK OB
Blood, UA: NEGATIVE
Glucose, UA: NEGATIVE
Ketones, UA: NEGATIVE
Nitrite, UA: NEGATIVE
POC,PROTEIN,UA: NEGATIVE

## 2019-05-10 NOTE — Progress Notes (Signed)
LOW-RISK PREGNANCY VISIT Patient name: Jennifer Aguilar MRN [id number]  Date of birth: 01/01/1996 Chief Complaint:   Routine Prenatal Visit  History of Present Illness:   Jennifer Aguilar is a 24 y.o. [phone] female at [redacted]w[redacted]d with an Estimated Date of Delivery: 07/06/19 being seen today for ongoing management of a low-risk pregnancy.  Depression screen [facility] 2/9 01/04/2019 05/09/2016 04/24/2016  Decreased Interest 0 0 0  Down, Depressed, Hopeless 0 0 0  PHQ - 2 Score 0 0 0  Altered sleeping 0 0 -  Tired, decreased energy 0 1 -  Change in appetite 0 0 -  Feeling bad or failure about yourself  0 0 -  Trouble concentrating 0 0 -  Moving slowly or fidgety/restless 0 0 -  Suicidal thoughts 0 0 -  PHQ-9 Score 0 1 -    Today she reports no complaints. Contractions: Not present. Vag. Bleeding: None.  Movement: Present. denies leaking of fluid. Review of Systems:   Pertinent items are noted in HPI Denies abnormal vaginal discharge w/ itching/odor/irritation, headaches, visual changes, shortness of breath, chest pain, abdominal pain, severe nausea/vomiting, or problems with urination or bowel movements unless otherwise stated above. Pertinent History Reviewed:  Reviewed past medical,surgical, social, obstetrical and family history.  Reviewed problem list, medications and allergies. Physical Assessment:   Vitals:   05/10/19 1426  BP: 116/77  Pulse: 97  Weight: 217 lb (98.4 kg)  Body mass index is 36.11 kg/m.        Physical Examination:   General appearance: Well appearing, and in no distress  Mental status: Alert, oriented to person, place, and time  Skin: Warm & dry  Cardiovascular: Normal heart rate noted  Respiratory: Normal respiratory effort, no distress  Abdomen: Soft, gravid, nontender  Pelvic: Cervical exam deferred         Extremities: Edema: None  Fetal Status: Fetal Heart Rate (bpm): 128 Fundal Height: 32 cm Movement: Present    Chaperone: n/a    Results for orders placed  or performed in visit on 05/10/19 (from the past 24 hour(s))  POC Urinalysis Dipstick OB   Collection Time: 05/10/19  2:27 PM  Result Value Ref Range   Color, UA     Clarity, UA     Glucose, UA Negative Negative   Bilirubin, UA     Ketones, UA neg    Spec Grav, UA     Blood, UA neg    pH, UA     POC,PROTEIN,UA Negative Negative, Trace, Small (1+), Moderate (2+), Large (3+), 4+   Urobilinogen, UA     Nitrite, UA neg    Leukocytes, UA Moderate (2+) (A) Negative   Appearance     Odor      Assessment & Plan:  1) Low-risk pregnancy [id number] at [redacted]w[redacted]d with an Estimated Date of Delivery: 07/06/19   2) Hx of GHTN, BP doing well thus far   Meds: No orders of the defined types were placed in this encounter.  Labs/procedures today:   Plan:  Continue routine obstetrical care  Next visit: prefers in person    Reviewed: Preterm labor symptoms and general obstetric precautions including but not limited to vaginal bleeding, contractions, leaking of fluid and fetal movement were reviewed in detail with the patient.  All questions were answered. Has home bp cuff. Rx faxed to . Check bp weekly, let us know if >140/90.   Follow-up: Return in about 3 weeks (around 05/31/2019) for [name].  Orders Placed This Encounter  Procedures  . Tdap vaccine greater than or equal to 7yo IM  . RHO (D) Immune Globulin  . POC Urinalysis Dipstick OB   Florian Buff  05/10/2019 3:30 PM

## 2019-05-31 ENCOUNTER — Ambulatory Visit (INDEPENDENT_AMBULATORY_CARE_PROVIDER_SITE_OTHER): Payer: Medicaid Other | Admitting: Women's Health

## 2019-05-31 ENCOUNTER — Encounter: Payer: Self-pay | Admitting: Women's Health

## 2019-05-31 VITALS — BP 137/80 | HR 105 | Wt 220.6 lb

## 2019-05-31 DIAGNOSIS — Z3483 Encounter for supervision of other normal pregnancy, third trimester: Secondary | ICD-10-CM

## 2019-05-31 DIAGNOSIS — Z1389 Encounter for screening for other disorder: Secondary | ICD-10-CM

## 2019-05-31 DIAGNOSIS — Z3A34 34 weeks gestation of pregnancy: Secondary | ICD-10-CM

## 2019-05-31 DIAGNOSIS — Z331 Pregnant state, incidental: Secondary | ICD-10-CM

## 2019-05-31 LAB — POCT URINALYSIS DIPSTICK OB
Blood, UA: NEGATIVE
Glucose, UA: NEGATIVE
Ketones, UA: NEGATIVE
Nitrite, UA: NEGATIVE
POC,PROTEIN,UA: NEGATIVE

## 2019-05-31 NOTE — Progress Notes (Signed)
LOW-RISK PREGNANCY VISIT Patient name: Jennifer Aguilar MRN [id number]  Date of birth: 11-15-1995 Chief Complaint:   Routine Prenatal Visit  History of Present Illness:   [name] is a 24 y.o. [phone] female at [redacted]w[redacted]d with an Estimated Date of Delivery: 07/06/19 being seen today for ongoing management of a low-risk pregnancy.  Depression screen [facility] 2/9 01/04/2019 05/09/2016 04/24/2016  Decreased Interest 0 0 0  Down, Depressed, Hopeless 0 0 0  PHQ - 2 Score 0 0 0  Altered sleeping 0 0 -  Tired, decreased energy 0 1 -  Change in appetite 0 0 -  Feeling bad or failure about yourself  0 0 -  Trouble concentrating 0 0 -  Moving slowly or fidgety/restless 0 0 -  Suicidal thoughts 0 0 -  PHQ-9 Score 0 1 -    Today she reports pressure, sciatica. Wants BTL. Contractions: Not present. Vag. Bleeding: None.  Movement: Present. denies leaking of fluid. Review of Systems:   Pertinent items are noted in HPI Denies abnormal vaginal discharge w/ itching/odor/irritation, headaches, visual changes, shortness of breath, chest pain, abdominal pain, severe nausea/vomiting, or problems with urination or bowel movements unless otherwise stated above. Pertinent History Reviewed:  Reviewed past medical,surgical, social, obstetrical and family history.  Reviewed problem list, medications and allergies. Physical Assessment:   Vitals:   05/31/19 1431  BP: 137/80  Pulse: (!) 105  Weight: 220 lb 9.6 oz (100.1 kg)  Body mass index is 36.71 kg/m.        Physical Examination:   General appearance: Well appearing, and in no distress  Mental status: Alert, oriented to person, place, and time  Skin: Warm & dry  Cardiovascular: Normal heart rate noted  Respiratory: Normal respiratory effort, no distress  Abdomen: Soft, gravid, nontender  Pelvic: Cervical exam deferred         Extremities: Edema: None  Fetal Status: Fetal Heart Rate (bpm): 150 Fundal Height: 34 cm Movement: Present    Chaperone: n/a     Results for orders placed or performed in visit on 05/31/19 (from the past 24 hour(s))  POC Urinalysis Dipstick OB   Collection Time: 05/31/19  2:30 PM  Result Value Ref Range   Color, UA     Clarity, UA     Glucose, UA Negative Negative   Bilirubin, UA     Ketones, UA n    Spec Grav, UA     Blood, UA n    pH, UA     POC,PROTEIN,UA Negative Negative, Trace, Small (1+), Moderate (2+), Large (3+), 4+   Urobilinogen, UA     Nitrite, UA n    Leukocytes, UA Trace (A) Negative   Appearance     Odor      Assessment & Plan:  1) Low-risk pregnancy [id number] at [redacted]w[redacted]d with an Estimated Date of Delivery: 07/06/19   2) Sciatica, gave printed exercises  3) Pressure> belt/tape  4) Wants BTL> reviewed risks/benefits, discussed high incidence regret <30yo, LARCs just as effective, consent signed today    Meds: No orders of the defined types were placed in this encounter.  Labs/procedures today: none  Plan:  Continue routine obstetrical care  Next visit: prefers will be in person for gbs    Reviewed: Preterm labor symptoms and general obstetric precautions including but not limited to vaginal bleeding, contractions, leaking of fluid and fetal movement were reviewed in detail with the patient.  All questions were answered. Has home bp cuff. Check bp  weekly, let us know if >140/90.   Follow-up: Return in about 2 weeks (around 06/14/2019) for [name], CNM, in person, Sign BTL consent today.  Orders Placed This Encounter  Procedures  . POC Urinalysis Dipstick OB   Roma Schanz CNM, [facility] 05/31/2019 2:45 PM

## 2019-05-31 NOTE — Patient Instructions (Addendum)
Jennifer Aguilar, I greatly value your feedback.  If you receive a survey following your visit with [place] today, we appreciate you taking the time to fill it out.  Thanks, Jennifer Aguilar, CNM, WHNP-BC  [facility] ([address] [place], [place] [address]) Entrance C, located off of [address] valet parking   Go to [email or web address] to register for FREE online childbirth classes    Call the office [phone]) or go to [facility] if:  You begin to have strong, frequent contractions  Your water breaks.  Sometimes it is a big gush of fluid, sometimes it is just a trickle that keeps getting your panties wet or running down your legs  You have vaginal bleeding.  It is normal to have a small amount of spotting if your cervix was checked.   You don't feel your baby moving like normal.  If you don't, get you something to eat and drink and lay down and focus on feeling your baby move.  You should feel at least 10 movements in 2 hours.  If you don't, you should call the office or go to [facility].   Call the office [phone]) or go to [facility] hospital for these signs of pre-eclampsia:  Severe headache that does not go away with Tylenol  Visual changes- seeing spots, double, blurred vision  Pain under your right breast or upper abdomen that does not go away with Tums or heartburn medicine  Nausea and/or vomiting  Severe swelling in your hands, feet, and face    Home Blood Pressure Monitoring for Patients   Your provider has recommended that you check your blood pressure (BP) at least once a week at home. If you do not have a blood pressure cuff at home, one will be provided for you. Contact your provider if you have not received your monitor within 1 week.   Helpful Tips for Accurate Home Blood Pressure Checks  . Don't smoke, exercise, or drink caffeine 30 minutes before checking your BP . Use the restroom before checking your BP (a full bladder  can raise your pressure) . Relax in a comfortable upright chair . Feet on the ground . Left arm resting comfortably on a flat surface at the level of your heart . Legs uncrossed . Back supported . Sit quietly and don't talk . Place the cuff on your bare arm . Adjust snuggly, so that only two fingertips can fit between your skin and the top of the cuff . Check 2 readings separated by at least one minute . Keep a log of your BP readings . For a visual, please reference this diagram: http://ccnc.care/bpdiagram  Provider Name: [facility]     Phone: [phone]  Zone 1: ALL CLEAR  Continue to monitor your symptoms:  . BP reading is less than 140 (top number) or less than 90 (bottom number)  . No right upper stomach pain . No headaches or seeing spots . No feeling nauseated or throwing up . No swelling in face and hands  Zone 2: CAUTION Call your doctor's office for any of the following:  . BP reading is greater than 140 (top number) or greater than 90 (bottom number)  . Stomach pain under your ribs in the middle or right side . Headaches or seeing spots . Feeling nauseated or throwing up . Swelling in face and hands  Zone 3: EMERGENCY  Seek immediate medical care if you have any of  the following:  . BP reading is greater than160 (top number) or greater than 110 (bottom number) . Severe headaches not improving with Tylenol . Serious difficulty catching your breath . Any worsening symptoms from Zone 2  Preterm Labor and Birth Information  The normal length of a pregnancy is 39-41 weeks. Preterm labor is when labor starts before 37 completed weeks of pregnancy. What are the risk factors for preterm labor? Preterm labor is more likely to occur in women who:  Have certain infections during pregnancy such as a bladder infection, sexually transmitted infection, or infection inside the uterus (chorioamnionitis).  Have a shorter-than-normal cervix.  Have gone into preterm  labor before.  Have had surgery on their cervix.  Are younger than age 24 or older than age 36.  Are African American.  Are pregnant with twins or multiple babies (multiple gestation).  Take street drugs or smoke while pregnant.  Do not gain enough weight while pregnant.  Became pregnant shortly after having been pregnant. What are the symptoms of preterm labor? Symptoms of preterm labor include:  Cramps similar to those that can happen during a menstrual period. The cramps may happen with diarrhea.  Pain in the abdomen or lower back.  Regular uterine contractions that may feel like tightening of the abdomen.  A feeling of increased pressure in the pelvis.  Increased watery or bloody mucus discharge from the vagina.  Water breaking (ruptured amniotic sac). Why is it important to recognize signs of preterm labor? It is important to recognize signs of preterm labor because babies who are born prematurely may not be fully developed. This can put them at an increased risk for:  Long-term (chronic) heart and lung problems.  Difficulty immediately after birth with regulating body systems, including blood sugar, body temperature, heart rate, and breathing rate.  Bleeding in the brain.  Cerebral palsy.  Learning difficulties.  Death. These risks are highest for babies who are born before 43 weeks of pregnancy. How is preterm labor treated? Treatment depends on the length of your pregnancy, your condition, and the health of your baby. It may involve: 1. Having a stitch (suture) placed in your cervix to prevent your cervix from opening too early (cerclage). 2. Taking or being given medicines, such as: ? Hormone medicines. These may be given early in pregnancy to help support the pregnancy. ? Medicine to stop contractions. ? Medicines to help mature the baby's lungs. These may be prescribed if the risk of delivery is high. ? Medicines to prevent your baby from developing  cerebral palsy. If the labor happens before 34 weeks of pregnancy, you may need to stay in the hospital. What should I do if I think I am in preterm labor? If you think that you are going into preterm labor, call your health care provider right away. How can I prevent preterm labor in future pregnancies? To increase your chance of having a full-term pregnancy:  Do not use any tobacco products, such as cigarettes, chewing tobacco, and e-cigarettes. If you need help quitting, ask your health care provider.  Do not use street drugs or medicines that have not been prescribed to you during your pregnancy.  Talk with your health care provider before taking any herbal supplements, even if you have been taking them regularly.  Make sure you gain a healthy amount of weight during your pregnancy.  Watch for infection. If you think that you might have an infection, get it checked right away.  Make sure to  tell your health care provider if you have gone into preterm labor before. This information is not intended to replace advice given to you by your health care provider. Make sure you discuss any questions you have with your health care provider. Document Revised: 04/24/2018 Document Reviewed: 05/24/2015 Elsevier Patient Education  2020 [employer]   Sciatica Rehab Ask your health care provider which exercises are safe for you. Do exercises exactly as told by your health care provider and adjust them as directed. It is normal to feel mild stretching, pulling, tightness, or discomfort as you do these exercises. Stop right away if you feel sudden pain or your pain gets worse. Do not begin these exercises until told by your health care provider. Stretching and range-of-motion exercises These exercises warm up your muscles and joints and improve the movement and flexibility of your hips and back. These exercises also help to relieve pain, numbness, and tingling. Sciatic nerve glide 1. Sit in a chair  with your head facing down toward your chest. Place your hands behind your back. Let your shoulders slump forward. 2. Slowly straighten one of your legs while you tilt your head back as if you are looking toward the ceiling. Only straighten your leg as far as you can without making your symptoms worse. 3. Hold this position for __________ seconds. 4. Slowly return to the starting position. 5. Repeat with your other leg. Repeat __________ times. Complete this exercise __________ times a day. Knee to chest with hip adduction and internal rotation  1. Lie on your back on a firm surface with both legs straight. 2. Bend one of your knees and move it up toward your chest until you feel a gentle stretch in your lower back and buttock. Then, move your knee toward the shoulder that is on the opposite side from your leg. This is hip adduction and internal rotation. ? Hold your leg in this position by holding on to the front of your knee. 3. Hold this position for __________ seconds. 4. Slowly return to the starting position. 5. Repeat with your other leg. Repeat __________ times. Complete this exercise __________ times a day. Prone extension on elbows  1. Lie on your abdomen on a firm surface. A bed may be too soft for this exercise. 2. Prop yourself up on your elbows. 3. Use your arms to help lift your chest up until you feel a gentle stretch in your abdomen and your lower back. ? This will place some of your body weight on your elbows. If this is uncomfortable, try stacking pillows under your chest. ? Your hips should stay down, against the surface that you are lying on. Keep your hip and back muscles relaxed. 4. Hold this position for __________ seconds. 5. Slowly relax your upper body and return to the starting position. Repeat __________ times. Complete this exercise __________ times a day. Strengthening exercises These exercises build strength and endurance in your back. Endurance is the  ability to use your muscles for a long time, even after they get tired. Pelvic tilt This exercise strengthens the muscles that lie deep in the abdomen. 1. Lie on your back on a firm surface. Bend your knees and keep your feet flat on the floor. 2. Tense your abdominal muscles. Tip your pelvis up toward the ceiling and flatten your lower back into the floor. ? To help with this exercise, you may place a small towel under your lower back and try to push your back into the  towel. 3. Hold this position for __________ seconds. 4. Let your muscles relax completely before you repeat this exercise. Repeat __________ times. Complete this exercise __________ times a day. Alternating arm and leg raises  1. Get on your hands and knees on a firm surface. If you are on a hard floor, you may want to use padding, such as an exercise mat, to cushion your knees. 2. Line up your arms and legs. Your hands should be directly below your shoulders, and your knees should be directly below your hips. 3. Lift your left leg behind you. At the same time, raise your right arm and straighten it in front of you. ? Do not lift your leg higher than your hip. ? Do not lift your arm higher than your shoulder. ? Keep your abdominal and back muscles tight. ? Keep your hips facing the ground. ? Do not arch your back. ? Keep your balance carefully, and do not hold your breath. 4. Hold this position for __________ seconds. 5. Slowly return to the starting position. 6. Repeat with your right leg and your left arm. Repeat __________ times. Complete this exercise __________ times a day. Posture and body mechanics Good posture and healthy body mechanics can help to relieve stress in your body's tissues and joints. Body mechanics refers to the movements and positions of your body while you do your daily activities. Posture is part of body mechanics. Good posture means:  Your spine is in its natural S-curve position (neutral).  Your  shoulders are pulled back slightly.  Your head is not tipped forward. Follow these guidelines to improve your posture and body mechanics in your everyday activities. Standing   When standing, keep your spine neutral and your feet about hip width apart. Keep a slight bend in your knees. Your ears, shoulders, and hips should line up.  When you do a task in which you stand in one place for a long time, place one foot up on a stable object that is 2-4 inches (5-10 cm) high, such as a footstool. This helps keep your spine neutral. Sitting   When sitting, keep your spine neutral and keep your feet flat on the floor. Use a footrest, if necessary, and keep your thighs parallel to the floor. Avoid rounding your shoulders, and avoid tilting your head forward.  When working at a desk or a computer, keep your desk at a height where your hands are slightly lower than your elbows. Slide your chair under your desk so you are close enough to maintain good posture.  When working at a computer, place your monitor at a height where you are looking straight ahead and you do not have to tilt your head forward or downward to look at the screen. Resting  When lying down and resting, avoid positions that are most painful for you.  If you have pain with activities such as sitting, bending, stooping, or squatting, lie in a position in which your body does not bend very much. For example, avoid curling up on your side with your arms and knees near your chest (fetal position).  If you have pain with activities such as standing for a long time or reaching with your arms, lie with your spine in a neutral position and bend your knees slightly. Try the following positions: ? Lying on your side with a pillow between your knees. ? Lying on your back with a pillow under your knees. Lifting   When lifting objects, keep your feet  at least shoulder width apart and tighten your abdominal muscles.  Bend your knees and hips  and keep your spine neutral. It is important to lift using the strength of your legs, not your back. Do not lock your knees straight out.  Always ask for help to lift heavy or awkward objects. This information is not intended to replace advice given to you by your health care provider. Make sure you discuss any questions you have with your health care provider. Document Revised: 04/24/2018 Document Reviewed: 01/22/2018 Elsevier Patient Education  [place].

## 2019-06-01 NOTE — Telephone Encounter (Signed)
Please sign encounter 

## 2019-06-15 ENCOUNTER — Encounter: Payer: Self-pay | Admitting: Obstetrics & Gynecology

## 2019-06-15 ENCOUNTER — Ambulatory Visit (INDEPENDENT_AMBULATORY_CARE_PROVIDER_SITE_OTHER): Payer: Medicaid Other | Admitting: Obstetrics & Gynecology

## 2019-06-15 ENCOUNTER — Other Ambulatory Visit (HOSPITAL_COMMUNITY)
Admission: RE | Admit: 2019-06-15 | Discharge: 2019-06-15 | Disposition: A | Discharge status: Home / Self Care | Payer: Medicaid Other | Source: Ambulatory Visit | Attending: Obstetrics & Gynecology | Admitting: Obstetrics & Gynecology

## 2019-06-15 VITALS — BP 125/86 | HR 106 | Wt 225.5 lb

## 2019-06-15 DIAGNOSIS — Z1389 Encounter for screening for other disorder: Secondary | ICD-10-CM

## 2019-06-15 DIAGNOSIS — Z3A37 37 weeks gestation of pregnancy: Secondary | ICD-10-CM | POA: Insufficient documentation

## 2019-06-15 DIAGNOSIS — Z3483 Encounter for supervision of other normal pregnancy, third trimester: Secondary | ICD-10-CM | POA: Diagnosis not present

## 2019-06-15 DIAGNOSIS — Z331 Pregnant state, incidental: Secondary | ICD-10-CM

## 2019-06-15 LAB — POCT URINALYSIS DIPSTICK OB
Blood, UA: NEGATIVE
Glucose, UA: NEGATIVE
Ketones, UA: NEGATIVE
Nitrite, UA: NEGATIVE
POC,PROTEIN,UA: NEGATIVE

## 2019-06-15 NOTE — Progress Notes (Signed)
LOW-RISK PREGNANCY VISIT Patient name: Jennifer Aguilar MRN [id number]  Date of birth: [birth year] Chief Complaint:   Routine Prenatal Visit (GBS, GC/CHL)  History of Present Illness:   Jennifer Aguilar is a 24 y.o. [phone] female at [redacted]w[redacted]d with an Estimated Date of Delivery: 07/06/19 being seen today for ongoing management of a low-risk pregnancy.  Depression screen [facility] 2/9 01/04/2019 05/09/2016 04/24/2016  Decreased Interest 0 0 0  Down, Depressed, Hopeless 0 0 0  PHQ - 2 Score 0 0 0  Altered sleeping 0 0 -  Tired, decreased energy 0 1 -  Change in appetite 0 0 -  Feeling bad or failure about yourself  0 0 -  Trouble concentrating 0 0 -  Moving slowly or fidgety/restless 0 0 -  Suicidal thoughts 0 0 -  PHQ-9 Score 0 1 -    Today she reports no complaints. Contractions: Not present. Vag. Bleeding: None.  Movement: Present. denies leaking of fluid. Review of Systems:   Pertinent items are noted in HPI Denies abnormal vaginal discharge w/ itching/odor/irritation, headaches, visual changes, shortness of breath, chest pain, abdominal pain, severe nausea/vomiting, or problems with urination or bowel movements unless otherwise stated above. Pertinent History Reviewed:  Reviewed past medical,surgical, social, obstetrical and family history.  Reviewed problem list, medications and allergies. Physical Assessment:   Vitals:   06/15/19 1451  BP: 125/86  Pulse: (!) 106  Weight: 225 lb 8 oz (102.3 kg)  Body mass index is 37.53 kg/m.        Physical Examination:   General appearance: Well appearing, and in no distress  Mental status: Alert, oriented to person, place, and time  Skin: Warm & dry  Cardiovascular: Normal heart rate noted  Respiratory: Normal respiratory effort, no distress  Abdomen: Soft, gravid, nontender  Pelvic: Cervical exam deferred         Extremities: Edema: None  Fetal Status:     Movement: Present    Chaperone: n/a    Results for orders placed or performed in visit  on 06/15/19 (from the past 24 hour(s))  POC Urinalysis Dipstick OB   Collection Time: 06/15/19  2:52 PM  Result Value Ref Range   Color, UA     Clarity, UA     Glucose, UA Negative Negative   Bilirubin, UA     Ketones, UA neg    Spec Grav, UA     Blood, UA neg    pH, UA     POC,PROTEIN,UA Negative Negative, Trace, Small (1+), Moderate (2+), Large (3+), 4+   Urobilinogen, UA     Nitrite, UA neg    Leukocytes, UA Moderate (2+) (A) Negative   Appearance     Odor      Assessment & Plan:  1) Low-risk pregnancy [id number] at [redacted]w[redacted]d with an Estimated Date of Delivery: 07/06/19   2) ,    Meds: No orders of the defined types were placed in this encounter.  Labs/procedures today: cultures done  Plan:  Continue routine obstetrical care  Next visit: prefers in person    Reviewed: Term labor symptoms and general obstetric precautions including but not limited to vaginal bleeding, contractions, leaking of fluid and fetal movement were reviewed in detail with the patient.  All questions were answered. has home bp cuff. Rx faxed to . Check bp weekly, let us know if >140/90.   Follow-up: Return in about 1 week (around 06/22/2019) for [name].  Orders Placed This Encounter  Procedures  . Strep  Gp B NAA  . POC Urinalysis Dipstick OB   Mertie Clause Kameran Lallier  06/15/2019 3:15 PM

## 2019-06-17 LAB — CERVICOVAGINAL ANCILLARY ONLY
Chlamydia: NEGATIVE
Comment: NEGATIVE
Comment: NORMAL
Neisseria Gonorrhea: NEGATIVE

## 2019-06-17 LAB — STREP GP B NAA: Strep Gp B NAA: NEGATIVE

## 2019-06-20 ENCOUNTER — Other Ambulatory Visit: Payer: Self-pay | Admitting: Family Medicine

## 2019-06-21 NOTE — Telephone Encounter (Signed)
Seen for rash 2/29/21

## 2019-06-22 ENCOUNTER — Ambulatory Visit (INDEPENDENT_AMBULATORY_CARE_PROVIDER_SITE_OTHER): Payer: Medicaid Other | Admitting: Women's Health

## 2019-06-22 ENCOUNTER — Other Ambulatory Visit: Payer: Self-pay

## 2019-06-22 ENCOUNTER — Encounter: Payer: Self-pay | Admitting: Women's Health

## 2019-06-22 ENCOUNTER — Encounter (HOSPITAL_COMMUNITY): Payer: Self-pay | Admitting: Family Medicine

## 2019-06-22 ENCOUNTER — Inpatient Hospital Stay (HOSPITAL_COMMUNITY)
Admission: AD | Admit: 2019-06-22 | Discharge: 2019-06-24 | DRG: 807 | Disposition: A | Discharge status: Home / Self Care | Payer: Medicaid Other | Attending: Family Medicine | Admitting: Family Medicine

## 2019-06-22 VITALS — BP 141/84 | HR 112 | Wt 228.4 lb

## 2019-06-22 DIAGNOSIS — O165 Unspecified maternal hypertension, complicating the puerperium: Secondary | ICD-10-CM | POA: Diagnosis present

## 2019-06-22 DIAGNOSIS — Z331 Pregnant state, incidental: Secondary | ICD-10-CM

## 2019-06-22 DIAGNOSIS — O26893 Other specified pregnancy related conditions, third trimester: Secondary | ICD-10-CM | POA: Diagnosis present

## 2019-06-22 DIAGNOSIS — Z3A38 38 weeks gestation of pregnancy: Secondary | ICD-10-CM

## 2019-06-22 DIAGNOSIS — Z20822 Contact with and (suspected) exposure to covid-19: Secondary | ICD-10-CM | POA: Diagnosis present

## 2019-06-22 DIAGNOSIS — Z6791 Unspecified blood type, Rh negative: Secondary | ICD-10-CM

## 2019-06-22 DIAGNOSIS — O134 Gestational [pregnancy-induced] hypertension without significant proteinuria, complicating childbirth: Principal | ICD-10-CM | POA: Diagnosis present

## 2019-06-22 DIAGNOSIS — Z1389 Encounter for screening for other disorder: Secondary | ICD-10-CM

## 2019-06-22 DIAGNOSIS — R87619 Unspecified abnormal cytological findings in specimens from cervix uteri: Secondary | ICD-10-CM | POA: Diagnosis present

## 2019-06-22 DIAGNOSIS — Z7982 Long term (current) use of aspirin: Secondary | ICD-10-CM | POA: Diagnosis not present

## 2019-06-22 DIAGNOSIS — Z3483 Encounter for supervision of other normal pregnancy, third trimester: Secondary | ICD-10-CM

## 2019-06-22 DIAGNOSIS — Z8759 Personal history of other complications of pregnancy, childbirth and the puerperium: Secondary | ICD-10-CM

## 2019-06-22 DIAGNOSIS — R030 Elevated blood-pressure reading, without diagnosis of hypertension: Secondary | ICD-10-CM

## 2019-06-22 LAB — POCT URINALYSIS DIPSTICK OB
Blood, UA: NEGATIVE
Glucose, UA: NEGATIVE
Ketones, UA: NEGATIVE
Nitrite, UA: NEGATIVE

## 2019-06-22 LAB — COMPREHENSIVE METABOLIC PANEL
ALT: 19 U/L (ref 0–44)
AST: 21 U/L (ref 15–41)
Albumin: 2.5 g/dL — ABNORMAL LOW (ref 3.5–5.0)
Alkaline Phosphatase: 176 U/L — ABNORMAL HIGH (ref 38–126)
Anion gap: 11 (ref 5–15)
BUN: 8 mg/dL (ref 6–20)
CO2: 17 mmol/L — ABNORMAL LOW (ref 22–32)
Calcium: 8.6 mg/dL — ABNORMAL LOW (ref 8.9–10.3)
Chloride: 109 mmol/L (ref 98–111)
Creatinine, Ser: 0.54 mg/dL (ref 0.44–1.00)
GFR calc Af Amer: >60 mL/min (ref >60)
GFR calc non Af Amer: >60 mL/min (ref >60)
Glucose, Bld: 77 mg/dL (ref 70–99)
Potassium: 3.5 mmol/L (ref 3.5–5.1)
Sodium: 137 mmol/L (ref 135–145)
Total Bilirubin: 0.2 mg/dL — ABNORMAL LOW (ref 0.3–1.2)
Total Protein: 6.3 g/dL — ABNORMAL LOW (ref 6.5–8.1)

## 2019-06-22 LAB — CBC
HCT: 35.2 % — ABNORMAL LOW (ref 36.0–46.0)
Hemoglobin: 11.3 g/dL — ABNORMAL LOW (ref 12.0–15.0)
MCH: 27.9 pg (ref 26.0–34.0)
MCHC: 32.1 g/dL (ref 30.0–36.0)
MCV: 86.9 fL (ref 80.0–100.0)
Platelets: 232 10*3/uL (ref 150–400)
RBC: 4.05 MIL/uL (ref 3.87–5.11)
RDW: 13.2 % (ref 11.5–15.5)
WBC: 11.5 10*3/uL — ABNORMAL HIGH (ref 4.0–10.5)
nRBC: 0 % (ref 0.0–0.2)

## 2019-06-22 LAB — TYPE AND SCREEN
ABO/RH(D): O NEG
Antibody Screen: POSITIVE

## 2019-06-22 LAB — SARS CORONAVIRUS 2 BY RT PCR (HOSPITAL ORDER, PERFORMED IN ~~LOC~~ HOSPITAL LAB): SARS Coronavirus 2: NEGATIVE

## 2019-06-22 MED ORDER — OXYTOCIN-SODIUM CHLORIDE 30-0.9 UT/500ML-% IV SOLN
2.5000 [IU]/h | INTRAVENOUS | Status: DC
  Administered 2019-06-23: 2.5 [IU]/h via INTRAVENOUS

## 2019-06-22 MED ORDER — FENTANYL CITRATE (PF) 100 MCG/2ML IJ SOLN: 100.0000 ug | INTRAMUSCULAR | Status: DC | PRN

## 2019-06-22 MED ORDER — LIDOCAINE HCL (PF) 1 % IJ SOLN: 30.0000 mL | INTRAMUSCULAR | Status: DC | PRN

## 2019-06-22 MED ORDER — ACETAMINOPHEN 325 MG PO TABS
650.0000 mg | ORAL_TABLET | ORAL | Status: DC | PRN
  Administered 2019-06-23: 650 mg via ORAL
  Filled 2019-06-22: qty 2

## 2019-06-22 MED ORDER — SOD CITRATE-CITRIC ACID 500-334 MG/5ML PO SOLN: 30.0000 mL | ORAL | Status: DC | PRN

## 2019-06-22 MED ORDER — OXYTOCIN BOLUS FROM INFUSION
500.0000 mL | Freq: Once | INTRAVENOUS | Status: AC
  Administered 2019-06-23: 500 mL via INTRAVENOUS

## 2019-06-22 MED ORDER — OXYTOCIN-SODIUM CHLORIDE 30-0.9 UT/500ML-% IV SOLN
1.0000 m[IU]/min | INTRAVENOUS | Status: DC
  Administered 2019-06-22: 2 m[IU]/min via INTRAVENOUS
  Filled 2019-06-22: qty 1000

## 2019-06-22 MED ORDER — LACTATED RINGERS IV SOLN: INTRAVENOUS | Status: DC

## 2019-06-22 MED ORDER — LACTATED RINGERS IV SOLN: 500.0000 mL | INTRAVENOUS | Status: DC | PRN

## 2019-06-22 MED ORDER — TERBUTALINE SULFATE 1 MG/ML IJ SOLN: 0.2500 mg | Freq: Once | INTRAMUSCULAR | Status: DC | PRN

## 2019-06-22 MED ORDER — MISOPROSTOL 50MCG HALF TABLET
50.0000 ug | ORAL_TABLET | ORAL | Status: DC | PRN
  Administered 2019-06-22: 50 ug via BUCCAL
  Filled 2019-06-22: qty 1

## 2019-06-22 MED ORDER — ONDANSETRON HCL 4 MG/2ML IJ SOLN: 4.0000 mg | Freq: Four times a day (QID) | INTRAMUSCULAR | Status: DC | PRN

## 2019-06-22 NOTE — H&P (Addendum)
OBSTETRIC ADMISSION HISTORY AND PHYSICAL  Jennifer Aguilar is a 24 y.o. female [phone] with IUP at [redacted]w[redacted]d by LMP presenting for IOL due to elevated Bps in setting of GHTN. She reports +FMs, No LOF, no VB, no blurry vision, headaches or peripheral edema, and RUQ pain. She plans on breast and bottle feeding. She requests BTL for birth control. She received her prenatal care at [facility]   Dating: By LMP --->  Estimated Date of Delivery: 07/06/19   Sono:   @[redacted]w[redacted]d , CWD, normal anatomy, breech presentation,  posterior placenta, 307g, 88% EFW  Prenatal History/Complications: -GHTN -prior pregnancy with pre-eclampsia  -Rh neg  Past Medical History: Past Medical History:  Diagnosis Date  . Menometrorrhagia 08/29/2014  . Seasonal allergies     Past Surgical History: Past Surgical History:  Procedure Laterality Date  . LAPAROSCOPY N/A 09/01/2018   Procedure: LAPAROSCOPY DIAGNOSTIC, REMOVAL OF INTRAPERITONEAL INTRAUTERINE DEVICE;  Surgeon: 09/03/2018, MD;  Location: [facility];  Service: Gynecology;  Laterality: N/A;  . NO PAST SURGERIES      Obstetrical History: OB History    Gravida  4   Para  3   Term  3   Preterm      AB      Living  3     SAB      TAB      Ectopic      Multiple  0   Live Births  3           Social History: Social History   Socioeconomic History  . Marital status: Single    Spouse name: Not on file  . Number of children: 3  . Years of education: Not on file  . Highest education level: Not on file  Occupational History  . Not on file  Tobacco Use  . Smoking status: Never Smoker  . Smokeless tobacco: Never Used  Substance and Sexual Activity  . Alcohol use: No  . Drug use: No  . Sexual activity: Yes    Birth control/protection: None  Other Topics Concern  . Not on file  Social History Narrative  . Not on file   Social Determinants of Health   Financial Resource Strain:   . Difficulty of Paying Living Expenses:   Food  Insecurity:   . Worried About Tilda Burrow in the Last Year:   . Programme researcher, broadcasting/film/video in the Last Year:   Transportation Needs:   . Barista (Medical):   Freight forwarder Lack of Transportation (Non-Medical):   Physical Activity:   . Days of Exercise per Week:   . Minutes of Exercise per Session:   Stress:   . Feeling of Stress :   Social Connections:   . Frequency of Communication with Friends and Family:   . Frequency of Social Gatherings with Friends and Family:   . Attends Religious Services:   . Active Member of Clubs or Organizations:   . Attends Marland Kitchen Meetings:   Banker Marital Status:     Family History: Family History  Problem Relation Age of Onset  . Diabetes Mother   . Hypertension Mother   . Hypertension Father   . Heart failure Maternal Grandfather   . Hypertension Sister   . Hypertension Paternal Grandfather   . Heart failure Maternal Grandmother   . Diabetes Maternal Grandmother   . Cancer Paternal Aunt   . Hypertension Paternal Uncle     Allergies: Allergies  Allergen Reactions  .  Meloxicam     REACTION: increased heart rate, body numbness; tolerates ibuprofen, per pt    Medications Prior to Admission  Medication Sig Dispense Refill Last Dose  . aspirin EC 81 MG tablet Take 2 tablets (162 mg total) by mouth daily. 60 tablet 8   . Blood Pressure Monitor MISC For regular home bp monitoring during pregnancy 1 each 0   . Prenatal Vit-Fe Fumarate-FA (PRENATAL VITAMIN PO) Take by mouth daily.     Marland Kitchen triamcinolone cream (KENALOG) 0.1 % APPLY THIN AMOUNT TO AFFECTED AREA TWICE A DAY AS NEEDED FOR ECZEMA 45 g 0      Review of Systems   All systems reviewed and negative except as stated in HPI  Blood pressure 129/81, pulse (!) 114, temperature 98.6 F (37 C), temperature source Oral, resp. rate 18, last menstrual period 09/29/2018. General appearance: alert, cooperative and no distress Lungs: normal effort Heart: regular rate  Abdomen: soft,  non-tender; bowel sounds normal Pelvic: gravid uterus Extremities: Homans sign is negative, no sign of DVT Presentation: cephalic Fetal monitoringBaseline: 150 bpm, Variability: Good {> 6 bpm), Accelerations: Reactive and Decelerations: Absent Uterine activity: frequent every 1-2 mins Dilation: 1.5 Effacement (%): 50 Station: -3 Exam by:: Dr. Morene Antu Prenatal labs: ABO, Rh: --/--/PENDING (06/08 1750) Antibody: PENDING (06/08 1750) Rubella: 3.81 (12/07 1414) RPR: Non Reactive (03/22 0923)  HBsAg: Negative (12/07 1414)  HIV: Non Reactive (03/22 0923)  GBS: Negative/-- (06/01 0000)  2 hr Glucola normal Genetic screening  Normal NT/IT, neg [place]/hgbE, declined MaterniT21 Anatomy US normal, female   Prenatal Transfer Tool  Maternal Diabetes: No Genetic Screening: Normal NT/IT, declined MaterniT21, neg [place] HgbE Maternal Ultrasounds/Referrals: Normal Fetal Ultrasounds or other Referrals:  None Maternal Substance Abuse:  No Significant Maternal Medications:  Meds include: Other: ASA 81 Significant Maternal Lab Results: Group B Strep negative  Results for orders placed or performed during the hospital encounter of 06/22/19 (from the past 24 hour(s))  CBC   Collection Time: 06/22/19  5:50 PM  Result Value Ref Range   WBC 11.5 (H) 4.0 - 10.5 K/uL   RBC 4.05 3.87 - 5.11 MIL/uL   Hemoglobin 11.3 (L) 12.0 - 15.0 g/dL   HCT 16.1 (L) 09.6 - 04.5 %   MCV 86.9 80.0 - 100.0 fL   MCH 27.9 26.0 - 34.0 pg   MCHC 32.1 30.0 - 36.0 g/dL   RDW 40.9 81.1 - 91.4 %   Platelets 232 150 - 400 K/uL   nRBC 0.0 0.0 - 0.2 %  Type and screen [facility]   Collection Time: 06/22/19  5:50 PM  Result Value Ref Range   ABO/RH(D) PENDING    Antibody Screen PENDING    Sample Expiration      06/25/2019,2359 Performed at [facility], [address], [place], [place] [address]   Results for orders placed or performed in visit on 06/22/19 (from the past 24 hour(s))  POC Urinalysis Dipstick OB    Collection Time: 06/22/19  2:06 PM  Result Value Ref Range   Color, UA     Clarity, UA     Glucose, UA Negative Negative   Bilirubin, UA     Ketones, UA neg    Spec Grav, UA     Blood, UA neg    pH, UA     POC,PROTEIN,UA Trace Negative, Trace, Small (1+), Moderate (2+), Large (3+), 4+   Urobilinogen, UA     Nitrite, UA neg    Leukocytes,  UA Moderate (2+) (A) Negative   Appearance     Odor      Patient Active Problem List   Diagnosis Date Noted  . Labor and delivery, indication for care 06/22/2019  . Supervision of normal pregnancy 01/04/2019  . History of gestational hypertension 01/04/2019  . Abnormal Pap smear of cervix 05/15/2016  . Rh negative state in antepartum period 11/07/2012    Assessment/Plan:  Jennifer Aguilar is a 24 y.o. [phone] at [redacted]w[redacted]d here for IOL 2/2 to [facility].   #Labor:induction with cytotec @ 7939, foley bulb placed 0300 #Pain: Labor support without medications  #FWB: Category I ; # EFW: 6lb, 8oz #ID:  GBS negative  #MOF: breast and bottle   #MOC: BTL, will plan for 6 weeks PP   #Circ:  No  #GHTN: BP 141/84, 143/88 in clinic today, CMP and Protein: Cr ratio, will monitor BP with vitals  #O Neg: rhogam given 05/10/19  Stark Klein, MD Family Medicine, PGY-1 06/22/2019, 6:18 PM  I saw and evaluated the patient. I agree with the findings and the plan of care as documented in the resident's note. Vertex by exam. IOL for gHTN vs Pre-E; labs pending, asymptomatic. Foley bulb placed manually and filled with 60 mL water; patient tolerated well. Will give Cytotec. Anticipate SVD. Interval BTL as paperwork not signed until 5/17. Rhogam eval PP.   Barrington Ellison, MD [facility] Family Medicine Fellow, [facility] for [employer], [place]

## 2019-06-22 NOTE — Progress Notes (Signed)
Patient ID: Jennifer Aguilar, female   DOB: Jun 29, 1995, 24 y.o.   MRN: [id number]  Jennifer Aguilar is a 24 y.o. [id number] at [redacted]w[redacted]d admitted for IOL 2/2 GHTN.  Subjective: Feeling well.  Tolerating contractions.  Desires SROM.   Objective: BP 132/80    Pulse (!) 106    Temp 98.6 F (37 C) (Oral)    Resp 16    Ht 5\' 5"  (1.651 m)    Wt 103.6 kg    LMP 09/29/2018    BMI 38.01 kg/m  No intake/output data recorded.  FHT:  FHR: 135 bpm, variability: moderate,  accelerations:  Present,  decelerations:  Absent UC:   regular, every 1-2 minutes  SVE:   Dilation: 6 Effacement (%): 50 Station: -2 Exam by:: Dr. [email or web address]  Pitocin @ 2 mu/min  Labs: Lab Results  Component Value Date   WBC 11.5 (H) 06/22/2019   HGB 11.3 (L) 06/22/2019   HCT 35.2 (L) 06/22/2019   MCV 86.9 06/22/2019   PLT 232 06/22/2019    Assessment / Plan: Jennifer Aguilar is a 24 y.o. G4P3003 at [redacted]w[redacted]d admitted for IOL 2/2 GHTN.  Labor: SROMed this check with clear fluid.  Continue pit. Fetal Wellbeing:  Category II due to one variable Pain Control:  Labor support without medications GHTN: BP currently well controlled.  Urine protein creatinine ratio pending.  Other labs unremarkable.   I/D:  GBS negative Anticipated MOD:  SVD O negative: Rhogam given 05/10/2019.  Eval postpartum.    Jennifer Aguilar 05/12/2019, MD PGY-2 Resident Family Medicine 06/22/2019, 11:14 PM

## 2019-06-22 NOTE — Progress Notes (Signed)
Patient ID: Jennifer Aguilar, female   DOB: 12-24-1995, 24 y.o.   MRN: [id number]  Jennifer Aguilar is a 24 y.o. [id number] at [redacted]w[redacted]d admitted for IOL 2/2 GHTN.  Subjective: Feeling well and tolerating contractions.  Declines empirical.    Objective: BP 135/72   Pulse (!) 113   Temp 98.6 F (37 C) (Oral)   Resp 18   Ht 5\' 5"  (1.651 m)   Wt 103.6 kg   LMP 09/29/2018   BMI 38.01 kg/m  No intake/output data recorded.  FHT:  FHR: 145 bpm, variability: moderate,  accelerations:  Present,  decelerations:  Present 1 variable UC:   irregular, every 2-5 minutes  SVE:   Dilation: 6 Effacement (%): 50 Station: -2 Exam by:: Dr. [email or web address]  Labs: Lab Results  Component Value Date   WBC 11.5 (H) 06/22/2019   HGB 11.3 (L) 06/22/2019   HCT 35.2 (L) 06/22/2019   MCV 86.9 06/22/2019   PLT 232 06/22/2019    Assessment / Plan: Jennifer Aguilar is a 24 y.o. G4P3003 at [redacted]w[redacted]d admitted for IOL 2/2 GHTN.  Labor: Progressing wel.  Start pitocin.   Fetal Wellbeing:  Category II due to one variable Pain Control:  Labor support without medications GHTN: BP currently well controlled.  Urine protein creatinine ratio pending.  Other labs unremarkable.   I/D:  GBS negative Anticipated MOD:  Vaginal delivery  O negative: Rhogam given 05/10/2019.  Eval postpartum.    Jennifer Aguilar 05/12/2019, MD PGY-2 Resident Family Medicine 06/22/2019, 10:07 PM

## 2019-06-22 NOTE — Progress Notes (Signed)
   LOW-RISK PREGNANCY VISIT Patient name: Jennifer Aguilar MRN [id number]  Date of birth: [birth year] Chief Complaint:   Routine Prenatal Visit  History of Present Illness:   Jennifer Aguilar is a 24 y.o. [phone] female at [redacted]w[redacted]d with an Estimated Date of Delivery: 07/06/19 being seen today for ongoing management of a low-risk pregnancy.  Depression screen [facility] 2/9 01/04/2019 05/09/2016 04/24/2016  Decreased Interest 0 0 0  Down, Depressed, Hopeless 0 0 0  PHQ - 2 Score 0 0 0  Altered sleeping 0 0 -  Tired, decreased energy 0 1 -  Change in appetite 0 0 -  Feeling bad or failure about yourself  0 0 -  Trouble concentrating 0 0 -  Moving slowly or fidgety/restless 0 0 -  Suicidal thoughts 0 0 -  PHQ-9 Score 0 1 -    Today she reports no complaints. Denies ha, visual changes, ruq/epigastric pain, n/v.   Contractions: Not present.  .  Movement: Present. denies leaking of fluid. Review of Systems:   Pertinent items are noted in HPI Denies abnormal vaginal discharge w/ itching/odor/irritation, headaches, visual changes, shortness of breath, chest pain, abdominal pain, severe nausea/vomiting, or problems with urination or bowel movements unless otherwise stated above. Pertinent History Reviewed:  Reviewed past medical,surgical, social, obstetrical and family history.  Reviewed problem list, medications and allergies. Physical Assessment:   Vitals:   06/22/19 1400 06/22/19 1407  BP: (!) 143/88 (!) 141/84  Pulse: (!) 111 (!) 112  Weight: 228 lb 6.4 oz (103.6 kg)   Body mass index is 38.01 kg/m.        Physical Examination:   General appearance: Well appearing, and in no distress  Mental status: Alert, oriented to person, place, and time  Skin: Warm & dry  Cardiovascular: Normal heart rate noted  Respiratory: Normal respiratory effort, no distress  Abdomen: Soft, gravid, nontender  Pelvic: Cervical exam deferred         Extremities: Edema: Trace, DTRs 2-3+  Fetal Status: Fetal Heart Rate  (bpm): 150 Fundal Height: 37 cm Movement: Present    Chaperone: n/a    Results for orders placed or performed in visit on 06/22/19 (from the past 24 hour(s))  POC Urinalysis Dipstick OB   Collection Time: 06/22/19  2:06 PM  Result Value Ref Range   Color, UA     Clarity, UA     Glucose, UA Negative Negative   Bilirubin, UA     Ketones, UA neg    Spec Grav, UA     Blood, UA neg    pH, UA     POC,PROTEIN,UA Trace Negative, Trace, Small (1+), Moderate (2+), Large (3+), 4+   Urobilinogen, UA     Nitrite, UA neg    Leukocytes, UA Moderate (2+) (A) Negative   Appearance     Odor      Assessment & Plan:  1) Low-risk pregnancy G4P3003 at [redacted]w[redacted]d with an Estimated Date of Delivery: 07/06/19   2) Elevated bp, w/ trace proteinuria, h/o GHTN. Asymptomatic. Will send to [facility] for GHTN/pre-e eval, notified Raelyn Mora, CNM   Meds: No orders of the defined types were placed in this encounter.  Labs/procedures today: none  Plan:  To [facility] Follow-up: Return for will call for appt if d/c'd.  Orders Placed This Encounter  Procedures  . POC Urinalysis Dipstick OB   Cheral Marker CNM, [facility] 06/22/2019 2:58 PM

## 2019-06-23 ENCOUNTER — Encounter (HOSPITAL_COMMUNITY): Payer: Self-pay | Admitting: Family Medicine

## 2019-06-23 DIAGNOSIS — Z3A38 38 weeks gestation of pregnancy: Secondary | ICD-10-CM

## 2019-06-23 DIAGNOSIS — O134 Gestational [pregnancy-induced] hypertension without significant proteinuria, complicating childbirth: Secondary | ICD-10-CM

## 2019-06-23 LAB — CBC
HCT: 36.5 % (ref 36.0–46.0)
Hemoglobin: 11.7 g/dL — ABNORMAL LOW (ref 12.0–15.0)
MCH: 28.2 pg (ref 26.0–34.0)
MCHC: 32.1 g/dL (ref 30.0–36.0)
MCV: 88 fL (ref 80.0–100.0)
Platelets: 240 10*3/uL (ref 150–400)
RBC: 4.15 MIL/uL (ref 3.87–5.11)
RDW: 13.3 % (ref 11.5–15.5)
WBC: 18.2 10*3/uL — ABNORMAL HIGH (ref 4.0–10.5)
nRBC: 0 % (ref 0.0–0.2)

## 2019-06-23 LAB — PROTEIN / CREATININE RATIO, URINE
Creatinine, Urine: 155.63 mg/dL
Protein Creatinine Ratio: 0.1 mg/mg{Cre} (ref 0.00–0.15)
Total Protein, Urine: 15 mg/dL

## 2019-06-23 LAB — RPR: RPR Ser Ql: NONREACTIVE

## 2019-06-23 MED ORDER — ACETAMINOPHEN 325 MG PO TABS: 650.0000 mg | ORAL_TABLET | ORAL | Status: DC | PRN

## 2019-06-23 MED ORDER — LIDOCAINE HCL (PF) 1 % IJ SOLN
INTRAMUSCULAR | Status: AC
  Filled 2019-06-23: qty 30

## 2019-06-23 MED ORDER — DIPHENHYDRAMINE HCL 25 MG PO CAPS: 25.0000 mg | ORAL_CAPSULE | Freq: Four times a day (QID) | ORAL | Status: DC | PRN

## 2019-06-23 MED ORDER — IBUPROFEN 600 MG PO TABS
600.0000 mg | ORAL_TABLET | Freq: Four times a day (QID) | ORAL | Status: DC
  Administered 2019-06-23 – 2019-06-24 (×5): 600 mg via ORAL
  Filled 2019-06-23 (×5): qty 1

## 2019-06-23 MED ORDER — DIBUCAINE (PERIANAL) 1 % EX OINT: 1.0000 "application " | TOPICAL_OINTMENT | CUTANEOUS | Status: DC | PRN

## 2019-06-23 MED ORDER — SENNOSIDES-DOCUSATE SODIUM 8.6-50 MG PO TABS
2.0000 | ORAL_TABLET | ORAL | Status: DC
  Administered 2019-06-23: 2 via ORAL
  Filled 2019-06-23: qty 2

## 2019-06-23 MED ORDER — ONDANSETRON HCL 4 MG PO TABS: 4.0000 mg | ORAL_TABLET | ORAL | Status: DC | PRN

## 2019-06-23 MED ORDER — WITCH HAZEL-GLYCERIN EX PADS: 1.0000 "application " | MEDICATED_PAD | CUTANEOUS | Status: DC | PRN

## 2019-06-23 MED ORDER — ZOLPIDEM TARTRATE 5 MG PO TABS: 5.0000 mg | ORAL_TABLET | Freq: Every evening | ORAL | Status: DC | PRN

## 2019-06-23 MED ORDER — BENZOCAINE-MENTHOL 20-0.5 % EX AERO
1.0000 "application " | INHALATION_SPRAY | CUTANEOUS | Status: DC | PRN
  Administered 2019-06-23: 1 via TOPICAL
  Filled 2019-06-23: qty 56

## 2019-06-23 MED ORDER — COCONUT OIL OIL: 1.0000 "application " | TOPICAL_OIL | Status: DC | PRN

## 2019-06-23 MED ORDER — TETANUS-DIPHTH-ACELL PERTUSSIS 5-2.5-18.5 LF-MCG/0.5 IM SUSP: 0.5000 mL | Freq: Once | INTRAMUSCULAR | Status: DC

## 2019-06-23 MED ORDER — SIMETHICONE 80 MG PO CHEW: 80.0000 mg | CHEWABLE_TABLET | ORAL | Status: DC | PRN

## 2019-06-23 MED ORDER — RHO D IMMUNE GLOBULIN 1500 UNIT/2ML IJ SOSY
300.0000 ug | PREFILLED_SYRINGE | Freq: Once | INTRAMUSCULAR | Status: AC
  Administered 2019-06-23: 300 ug via INTRAVENOUS
  Filled 2019-06-23: qty 2

## 2019-06-23 MED ORDER — ONDANSETRON HCL 4 MG/2ML IJ SOLN: 4.0000 mg | INTRAMUSCULAR | Status: DC | PRN

## 2019-06-23 MED ORDER — PRENATAL MULTIVITAMIN CH
1.0000 | ORAL_TABLET | Freq: Every day | ORAL | Status: DC
  Administered 2019-06-23: 1 via ORAL
  Filled 2019-06-23: qty 1

## 2019-06-23 NOTE — Discharge Instructions (Signed)

## 2019-06-23 NOTE — Discharge Summary (Addendum)
Postpartum Discharge Summary     Patient Name: Jennifer Aguilar DOB: [birth year] MRN: [id number]  Date of admission: 06/22/2019 Delivery date:06/23/2019  Delivering provider: Truett Mainland  Date of discharge: 06/24/2019  Admitting diagnosis: Labor and delivery, indication for care [O75.9] Intrauterine pregnancy: [redacted]w[redacted]d    Secondary diagnosis:  Active Problems:   Rh negative state in antepartum period   Abnormal Pap smear of cervix   History of gestational hypertension   Labor and delivery, indication for care   Postpartum hypertension  Additional problems: None    Discharge diagnosis: Term Pregnancy Delivered                                              Post partum procedures:rhogam Augmentation: AROM, Pitocin, Cytotec and IP Foley Complications: None  Hospital course: Induction of Labor With Vaginal Delivery   24y.o. yo [phone] admitted to the hospital 06/22/2019 for induction of labor.  Indication for induction: Gestational hypertension.  Patient had an uncomplicated labor course as follows:  Induction was started with FB and cytotec. She then received Pitocin. She was AROMed and then progressed to complete, delivering shortly after.  Membrane Rupture Time/Date: 11:10 PM ,06/22/2019   Delivery Method:Vaginal, Spontaneous  Episiotomy: None  Lacerations:  Labial  Details of delivery can be found in separate delivery note.  Patient had a routine postpartum course with the addition of Norvasc 9m41mtarted on PPD#1 prior to d/c for BPs 130/90s.  Patient is discharged home 06/24/19 per her request for early discharge.  Newborn Data: Birth date:06/23/2019  Birth time:12:46 AM  Gender:Female  Living status:Living  Apgars:8 ,9  Weight:3589 g   Magnesium Sulfate received: No BMZ received: No Rhophylac:Yes MMR:N/A T-DaP:Given prenatally Flu: N/A Transfusion:No  Physical exam  Vitals:   06/23/19 1147 06/23/19 1539 06/23/19 2140 06/24/19 0525  BP: 129/83 (!) 131/92  128/73 132/87  Pulse: 89 83 93 80  Resp: '18 17 16 17  ' Temp: 98.1 F (36.7 C) 98.2 F (36.8 C) 98 F (36.7 C) 97.8 F (36.6 C)  TempSrc: Oral Oral Oral Oral  SpO2:    100%  Weight:      Height:       General: alert, cooperative and no distress Lochia: appropriate Uterine Fundus: firm Incision: N/A DVT Evaluation: No evidence of DVT seen on physical exam. Labs: Lab Results  Component Value Date   WBC 18.2 (H) 06/23/2019   HGB 11.7 (L) 06/23/2019   HCT 36.5 06/23/2019   MCV 88.0 06/23/2019   PLT 240 06/23/2019   CMP Latest Ref Rng & Units 06/22/2019  Glucose 70 - 99 mg/dL 77  BUN 6 - 20 mg/dL 8  Creatinine 0.44 - 1.00 mg/dL 0.54  Sodium 135 - 145 mmol/L 137  Potassium 3.5 - 5.1 mmol/L 3.5  Chloride 98 - 111 mmol/L 109  CO2 22 - 32 mmol/L 17(L)  Calcium 8.9 - 10.3 mg/dL 8.6(L)  Total Protein 6.5 - 8.1 g/dL 6.3(L)  Total Bilirubin 0.3 - 1.2 mg/dL 0.2(L)  Alkaline Phos 38 - 126 U/L 176(H)  AST 15 - 41 U/L 21  ALT 0 - 44 U/L 19   Edinburgh Score: Edinburgh Postnatal Depression Scale Screening Tool 06/23/2019  I have been able to laugh and see the funny side of things. 0  I have looked forward with enjoyment to things.  0  I have blamed myself unnecessarily when things went wrong. 1  I have been anxious or worried for no good reason. 1  I have felt scared or panicky for no good reason. 0  Things have been getting on top of me. 1  I have been so unhappy that I have had difficulty sleeping. 0  I have felt sad or miserable. 0  I have been so unhappy that I have been crying. 0  The thought of harming myself has occurred to me. 0  Edinburgh Postnatal Depression Scale Total 3     After visit meds:  Allergies as of 06/24/2019      Reactions   Meloxicam    REACTION: increased heart rate, body numbness; tolerates ibuprofen, per pt      Medication List    STOP taking these medications   aspirin EC 81 MG tablet   Blood Pressure Monitor Misc     TAKE these medications    acetaminophen 325 MG tablet Commonly known as: Tylenol Take 2 tablets (650 mg total) by mouth every 4 (four) hours as needed (for pain scale < 4).   amLODipine 5 MG tablet Commonly known as: NORVASC Take 1 tablet (5 mg total) by mouth daily.   ibuprofen 600 MG tablet Commonly known as: ADVIL Take 1 tablet (600 mg total) by mouth every 6 (six) hours.   PRENATAL VITAMIN PO Take by mouth daily.   triamcinolone cream 0.1 % Commonly known as: KENALOG APPLY THIN AMOUNT TO AFFECTED AREA TWICE A DAY AS NEEDED FOR ECZEMA What changed: See the new instructions.        Discharge home in stable condition Infant Feeding: Bottle and Breast Infant Disposition:home with mother Discharge instruction: per After Visit Summary and Postpartum booklet. Activity: Advance as tolerated. Pelvic rest for 6 weeks.  Diet: routine diet Future Appointments: Future Appointments  Date Time Provider [place]  06/30/2019  2:50 PM [facility] NURSE [facility]  07/29/2019 11:30 AM Cresenzo-Dishmon, Joaquim Lai, CNM [facility]   Follow up Visit:   Please schedule this patient for a In person postpartum visit in 4 weeks with the following provider: MD for BTL. Additional Postpartum F/U:BP check 1 week  High risk pregnancy complicated by: g HTN Delivery mode:  Vaginal, Spontaneous  Anticipated Birth Control:  Plans Interval BTL   Ian Bushman, MD PGY-2 Resident Family Medicine 06/24/2019, 8:33 AM   CNM attestation I have seen and examined this patient and agree with above documentation in the resident's note.   Jennifer Aguilar is a 24 y.o. [phone] s/p vag del.   Pain is well controlled.  Plan for birth control is interval BTL.  Method of Feeding: both  PE:  BP 132/87 (BP Location: Right Arm)   Pulse 80   Temp 97.8 F (36.6 C) (Oral)   Resp 17   Ht '5\' 5"'  (1.651 m)   Wt 103.6 kg   LMP 09/29/2018   SpO2 100%   Breastfeeding Unknown   BMI 38.01 kg/m  Fundus firm  Recent Labs     06/22/19 1750 06/23/19 0610  HGB 11.3* 11.7*  HCT 35.2* 36.5     Plan: discharge today - Start on Norvasc 24m prior to d/c for postpartum hypertension - postpartum care discussed - f/u clinic in 1 weeks for BP check and pre-op BTL visit (signed 30d papers 05/31/19)   KMyrtis Ser CNM 8:33 AM  06/24/2019

## 2019-06-24 DIAGNOSIS — O165 Unspecified maternal hypertension, complicating the puerperium: Secondary | ICD-10-CM | POA: Diagnosis not present

## 2019-06-24 LAB — RH IG WORKUP (INCLUDES ABO/RH)
ABO/RH(D): O NEG
Fetal Screen: NEGATIVE
Gestational Age(Wks): 38
Unit division: 0

## 2019-06-24 MED ORDER — AMLODIPINE BESYLATE 5 MG PO TABS: 5.0000 mg | ORAL_TABLET | Freq: Every day | ORAL | 2 refills | Status: DC

## 2019-06-24 MED ORDER — AMLODIPINE BESYLATE 5 MG PO TABS: 5.0000 mg | ORAL_TABLET | Freq: Every day | ORAL | Status: DC

## 2019-06-24 MED ORDER — IBUPROFEN 600 MG PO TABS: 600.0000 mg | ORAL_TABLET | Freq: Four times a day (QID) | ORAL | 0 refills | Status: DC

## 2019-06-24 MED ORDER — ACETAMINOPHEN 325 MG PO TABS: 650.0000 mg | ORAL_TABLET | ORAL | 1 refills | Status: DC | PRN

## 2019-06-28 ENCOUNTER — Other Ambulatory Visit: Payer: Medicaid Other

## 2019-06-30 ENCOUNTER — Ambulatory Visit (INDEPENDENT_AMBULATORY_CARE_PROVIDER_SITE_OTHER): Payer: Medicaid Other

## 2019-06-30 VITALS — BP 124/85 | HR 89 | Ht 65.0 in | Wt 207.0 lb

## 2019-06-30 DIAGNOSIS — Z0130 Encounter for examination of blood pressure without abnormal findings: Secondary | ICD-10-CM

## 2019-06-30 NOTE — Progress Notes (Signed)
   NURSE VISIT- BLOOD PRESSURE CHECK  SUBJECTIVE:  Jennifer Aguilar is a 24 y.o. [phone] female here for BP check. She postpartum, delivery date 06-22-19   postpartum:  . Severe headaches that don't go away with tylenol/other medicines: No  . Visual changes (seeing spots/double/blurred vision) No  . Severe pain under right breast breast or in center of upper chest No  . Severe nausea/vomiting No . Taking medicines as instructed     OBJECTIVE:  BP 124/85 (BP Location: Left Arm, Patient Position: Sitting, Cuff Size: Normal)   Pulse 89   Ht 5\' 5"  (1.651 m)   Wt 207 lb (93.9 kg)   Breastfeeding Yes   BMI 34.45 kg/m   Appearance alert, well appearing, and in no distress.  ASSESSMENT: Postpartum  blood pressure check  PLAN: Discussed with Dr.   Recommendations: no changes needed. Keep taking amLODIPine 5 mg  Follow-up: in 4 weeks  For postpartum visit on 07-29-2019. Pre-op BTL on 07-08-19  [facility]-08-[facility]  06/30/2019 3:00 PM

## 2019-07-08 ENCOUNTER — Ambulatory Visit (INDEPENDENT_AMBULATORY_CARE_PROVIDER_SITE_OTHER): Payer: Medicaid Other | Admitting: Obstetrics & Gynecology

## 2019-07-08 ENCOUNTER — Encounter: Payer: Self-pay | Admitting: Obstetrics & Gynecology

## 2019-07-08 ENCOUNTER — Other Ambulatory Visit: Payer: Self-pay

## 2019-07-08 VITALS — BP 125/84 | HR 83 | Ht 65.0 in | Wt 204.8 lb

## 2019-07-08 DIAGNOSIS — Z3009 Encounter for other general counseling and advice on contraception: Secondary | ICD-10-CM | POA: Diagnosis not present

## 2019-07-08 NOTE — Progress Notes (Signed)
Preoperative History and Physical  Jennifer Aguilar is a 24 y.o. [phone] with No LMP recorded. (Menstrual status: Lactating). admitted for a laparoscopic bilateral salpingectomy for the purpose of permanent sterilization.  07/21/19  PMH:    Past Medical History:  Diagnosis Date  . Menometrorrhagia 08/29/2014  . Seasonal allergies     PSH:     Past Surgical History:  Procedure Laterality Date  . LAPAROSCOPY N/A 09/01/2018   Procedure: LAPAROSCOPY DIAGNOSTIC, REMOVAL OF INTRAPERITONEAL INTRAUTERINE DEVICE;  Surgeon: Jonnie Kind, MD;  Location: [facility];  Service: Gynecology;  Laterality: N/A;  . NO PAST SURGERIES      POb/GynH:      OB History    Gravida  4   Para  4   Term  4   Preterm      AB      Living  4     SAB      TAB      Ectopic      Multiple  0   Live Births  4           SH:   Social History   Tobacco Use  . Smoking status: Never Smoker  . Smokeless tobacco: Never Used  Vaping Use  . Vaping Use: Never used  Substance Use Topics  . Alcohol use: No  . Drug use: No    FH:    Family History  Problem Relation Age of Onset  . Diabetes Mother   . Hypertension Mother   . Hypertension Father   . Heart failure Maternal Grandfather   . Hypertension Sister   . Hypertension Paternal Grandfather   . Heart failure Maternal Grandmother   . Diabetes Maternal Grandmother   . Cancer Paternal Aunt   . Hypertension Paternal Uncle      Allergies:  Allergies  Allergen Reactions  . Meloxicam     REACTION: increased heart rate, body numbness; tolerates ibuprofen, per pt    Medications:       Current Outpatient Medications:  .  amLODipine (NORVASC) 5 MG tablet, Take 1 tablet (5 mg total) by mouth daily., Disp: 30 tablet, Rfl: 2 .  ibuprofen (ADVIL) 600 MG tablet, Take 1 tablet (600 mg total) by mouth every 6 (six) hours. (Patient not taking: Reported on 07/08/2019), Disp: 30 tablet, Rfl: 0 .  Prenatal Vit-Fe Fumarate-FA (PRENATAL VITAMIN PO), Take  by mouth daily., Disp: , Rfl:  .  triamcinolone cream (KENALOG) 0.1 %, APPLY THIN AMOUNT TO AFFECTED AREA TWICE A DAY AS NEEDED FOR ECZEMA (Patient not taking: Reported on 07/08/2019), Disp: 45 g, Rfl: 0  Review of Systems:   Review of Systems  Constitutional: Negative for fever, chills, weight loss, malaise/fatigue and diaphoresis.  HENT: Negative for hearing loss, ear pain, nosebleeds, congestion, sore throat, neck pain, tinnitus and ear discharge.   Eyes: Negative for blurred vision, double vision, photophobia, pain, discharge and redness.  Respiratory: Negative for cough, hemoptysis, sputum production, shortness of breath, wheezing and stridor.   Cardiovascular: Negative for chest pain, palpitations, orthopnea, claudication, leg swelling and PND.  Gastrointestinal: Positive for abdominal pain. Negative for heartburn, nausea, vomiting, diarrhea, constipation, blood in stool and melena.  Genitourinary: Negative for dysuria, urgency, frequency, hematuria and flank pain.  Musculoskeletal: Negative for myalgias, back pain, joint pain and falls.  Skin: Negative for itching and rash.  Neurological: Negative for dizziness, tingling, tremors, sensory change, speech change, focal weakness, seizures, loss of consciousness, weakness and headaches.  Endo/Heme/Allergies: Negative for  environmental allergies and polydipsia. Does not bruise/bleed easily.  Psychiatric/Behavioral: Negative for depression, suicidal ideas, hallucinations, memory loss and substance abuse. The patient is not nervous/anxious and does not have insomnia.      PHYSICAL EXAM:  Blood pressure 125/84, pulse 83, height 5\' 5"  (1.651 m), weight 204 lb 12.8 oz (92.9 kg), currently breastfeeding.    Vitals reviewed. Constitutional: She is oriented to person, place, and time. She appears well-developed and well-nourished.  HENT:  Head: Normocephalic and atraumatic.  Right Ear: External ear normal.  Left Ear: External ear normal.   Nose: Nose normal.  Mouth/Throat: Oropharynx is clear and moist.  Eyes: Conjunctivae and EOM are normal. Pupils are equal, round, and reactive to light. Right eye exhibits no discharge. Left eye exhibits no discharge. No scleral icterus.  Neck: Normal range of motion. Neck supple. No tracheal deviation present. No thyromegaly present.  Cardiovascular: Normal rate, regular rhythm, normal heart sounds and intact distal pulses.  Exam reveals no gallop and no friction rub.   No murmur heard. Respiratory: Effort normal and breath sounds normal. No respiratory distress. She has no wheezes. She has no rales. She exhibits no tenderness.  GI: Soft. Bowel sounds are normal. She exhibits no distension and no mass. There is tenderness. There is no rebound and no guarding.  Genitourinary:       Vulva is normal without lesions Vagina is pink moist without discharge Cervix normal in appearance and pap is normal Uterus is normal size, contour, position, consistency, mobility, non-tender Adnexa is negative with normal sized ovaries by sonogram  Musculoskeletal: Normal range of motion. She exhibits no edema and no tenderness.  Neurological: She is alert and oriented to person, place, and time. She has normal reflexes. She displays normal reflexes. No cranial nerve deficit. She exhibits normal muscle tone. Coordination normal.  Skin: Skin is warm and dry. No rash noted. No erythema. No pallor.  Psychiatric: She has a normal mood and affect. Her behavior is normal. Judgment and thought content normal.    Labs: No results found for this or any previous visit (from the past 336 hour(s)).  EKG: No orders found for this or any previous visit.  Imaging Studies: No results found.    Assessment: Multiparous female desires permanent sterilization, specifically, bilateral salpingectomy Patient Active Problem List   Diagnosis Date Noted  . Postpartum hypertension 06/24/2019  . Labor and delivery, indication  for care 06/22/2019  . Supervision of normal pregnancy 01/04/2019  . History of gestational hypertension 01/04/2019  . Abnormal Pap smear of cervix 05/15/2016  . Rh negative state in antepartum period 11/07/2012    Plan: Laparoscopic bilateral salpingectomy for sterilization 07/21/19  09/21/19 07/08/2019 12:04 PM     Face to face time:  20 minutes  Greater than 50% of the visit time was spent in counseling and coordination of care with the patient.  The summary and outline of the counseling and care coordination is summarized in the note above.   All questions were answered.

## 2019-07-11 ENCOUNTER — Other Ambulatory Visit: Payer: Self-pay | Admitting: Obstetrics & Gynecology

## 2019-07-13 NOTE — Patient Instructions (Signed)
Your procedure is scheduled on: 07/21/2019  Report to [facility] at   8:30  AM.  Call this number if you have problems the morning of surgery: [phone]   Remember:   Do not Eat or Drink after midnight except drink 1 ensure carbohydrate drink at 4:30        No Smoking the morning of surgery  :  Take these medicines the morning of surgery with A SIP OF WATER: Amlodipine   Do not wear jewelry, make-up or nail polish.  Do not wear lotions, powders, or perfumes. You may wear deodorant.  Do not shave 48 hours prior to surgery. Men may shave face and neck.  Do not bring valuables to the hospital.  Contacts, dentures or bridgework may not be worn into surgery.  Leave suitcase in the car. After surgery it may be brought to your room.  For patients admitted to the hospital, checkout time is 11:00 AM the day of discharge.   Patients discharged the day of surgery will not be allowed to drive home.    Special Instructions: Shower using CHG night before surgery and shower the day of surgery use CHG.  Use special wash - you have one bottle of CHG for all showers.  You should use approximately 1/2 of the bottle for each shower.  How to Use Chlorhexidine for Bathing Chlorhexidine gluconate (CHG) is a germ-killing (antiseptic) solution that is used to clean the skin. It can get rid of the bacteria that normally live on the skin and can keep them away for about 24 hours. To clean your skin with CHG, you may be given:  A CHG solution to use in the shower or as part of a sponge bath.  A prepackaged cloth that contains CHG. Cleaning your skin with CHG may help lower the risk for infection:  While you are staying in the intensive care unit of the hospital.  If you have a vascular access, such as a central line, to provide short-term or long-term access to your veins.  If you have a catheter to drain urine from your bladder.  If you are on a ventilator. A ventilator is a machine that helps you  breathe by moving air in and out of your lungs.  After surgery. What are the risks? Risks of using CHG include:  A skin reaction.  Hearing loss, if CHG gets in your ears.  Eye injury, if CHG gets in your eyes and is not rinsed out.  The CHG product catching fire. Make sure that you avoid smoking and flames after applying CHG to your skin. Do not use CHG:  If you have a chlorhexidine allergy or have previously reacted to chlorhexidine.  On babies younger than 372 months of age. How to use CHG solution  Use CHG only as told by your health care provider, and follow the instructions on the label.  Use the full amount of CHG as directed. Usually, this is one bottle. During a shower Follow these steps when using CHG solution during a shower (unless your health care provider gives you different instructions): 1. Start the shower. 2. Use your normal soap and shampoo to wash your face and hair. 3. Turn off the shower or move out of the shower stream. 4. Pour the CHG onto a clean washcloth. Do not use any type of brush or rough-edged sponge. 5. Starting at your neck, lather your body down to your toes. Make sure you follow these instructions: ? If you  will be having surgery, pay special attention to the part of your body where you will be having surgery. Scrub this area for at least 1 minute. ? Do not use CHG on your head or face. If the solution gets into your ears or eyes, rinse them well with water. ? Avoid your genital area. ? Avoid any areas of skin that have broken skin, cuts, or scrapes. ? Scrub your back and under your arms. Make sure to wash skin folds. 6. Let the lather sit on your skin for 1-2 minutes or as long as told by your health care provider. 7. Thoroughly rinse your entire body in the shower. Make sure that all body creases and crevices are rinsed well. 8. Dry off with a clean towel. Do not put any substances on your body afterward--such as powder, lotion, or  perfume--unless you are told to do so by your health care provider. Only use lotions that are recommended by the manufacturer. 9. Put on clean clothes or pajamas. 10. If it is the night before your surgery, sleep in clean sheets.  During a sponge bath Follow these steps when using CHG solution during a sponge bath (unless your health care provider gives you different instructions): 1. Use your normal soap and shampoo to wash your face and hair. 2. Pour the CHG onto a clean washcloth. 3. Starting at your neck, lather your body down to your toes. Make sure you follow these instructions: ? If you will be having surgery, pay special attention to the part of your body where you will be having surgery. Scrub this area for at least 1 minute. ? Do not use CHG on your head or face. If the solution gets into your ears or eyes, rinse them well with water. ? Avoid your genital area. ? Avoid any areas of skin that have broken skin, cuts, or scrapes. ? Scrub your back and under your arms. Make sure to wash skin folds. 4. Let the lather sit on your skin for 1-2 minutes or as long as told by your health care provider. 5. Using a different clean, wet washcloth, thoroughly rinse your entire body. Make sure that all body creases and crevices are rinsed well. 6. Dry off with a clean towel. Do not put any substances on your body afterward--such as powder, lotion, or perfume--unless you are told to do so by your health care provider. Only use lotions that are recommended by the manufacturer. 7. Put on clean clothes or pajamas. 8. If it is the night before your surgery, sleep in clean sheets. How to use CHG prepackaged cloths  Only use CHG cloths as told by your health care provider, and follow the instructions on the label.  Use the CHG cloth on clean, dry skin.  Do not use the CHG cloth on your head or face unless your health care provider tells you to.  When washing with the CHG cloth: ? Avoid your genital  area. ? Avoid any areas of skin that have broken skin, cuts, or scrapes. Before surgery Follow these steps when using a CHG cloth to clean before surgery (unless your health care provider gives you different instructions): 1. Using the CHG cloth, vigorously scrub the part of your body where you will be having surgery. Scrub using a back-and-forth motion for 3 minutes. The area on your body should be completely wet with CHG when you are done scrubbing. 2. Do not rinse. Discard the cloth and let the area air-dry. Do not  put any substances on the area afterward, such as powder, lotion, or perfume. 3. Put on clean clothes or pajamas. 4. If it is the night before your surgery, sleep in clean sheets.  For general bathing Follow these steps when using CHG cloths for general bathing (unless your health care provider gives you different instructions). 1. Use a separate CHG cloth for each area of your body. Make sure you wash between any folds of skin and between your fingers and toes. Wash your body in the following order, switching to a new cloth after each step: ? The front of your neck, shoulders, and chest. ? Both of your arms, under your arms, and your hands. ? Your stomach and groin area, avoiding the genitals. ? Your right leg and foot. ? Your left leg and foot. ? The back of your neck, your back, and your buttocks. 2. Do not rinse. Discard the cloth and let the area air-dry. Do not put any substances on your body afterward--such as powder, lotion, or perfume--unless you are told to do so by your health care provider. Only use lotions that are recommended by the manufacturer. 3. Put on clean clothes or pajamas. Contact a health care provider if:  Your skin gets irritated after scrubbing.  You have questions about using your solution or cloth. Get help right away if:  Your eyes become very red or swollen.  Your eyes itch badly.  Your skin itches badly and is red or swollen.  Your  hearing changes.  You have trouble seeing.  You have swelling or tingling in your mouth or throat.  You have trouble breathing.  You swallow any chlorhexidine. Summary  Chlorhexidine gluconate (CHG) is a germ-killing (antiseptic) solution that is used to clean the skin. Cleaning your skin with CHG may help to lower your risk for infection.  You may be given CHG to use for bathing. It may be in a bottle or in a prepackaged cloth to use on your skin. Carefully follow your health care provider's instructions and the instructions on the product label.  Do not use CHG if you have a chlorhexidine allergy.  Contact your health care provider if your skin gets irritated after scrubbing. This information is not intended to replace advice given to you by your health care provider. Make sure you discuss any questions you have with your health care provider. Document Revised: 03/19/2018 Document Reviewed: 11/28/2016 Elsevier Patient Education  2020 [employer]  Laparoscopic Bilateral Salpingectomy, Care After This sheet gives you information about how to care for yourself after your procedure. Your health care provider may also give you more specific instructions. If you have problems or questions, contact your health care provider. What can I expect after the procedure? After the procedure, it is common to have:  A sore throat.  Discomfort in your shoulder.  Mild discomfort or cramping in your abdomen.  Gas pains.  Pain or soreness in the area where the surgical incision was made.  A bloated feeling.  Tiredness.  Nausea.  Vomiting. Follow these instructions at home: Medicines  Take over-the-counter and prescription medicines only as told by your health care provider.  Do not take aspirin because it can cause bleeding.  Ask your health care provider if the medicine prescribed to you: ? Requires you to avoid driving or using heavy machinery. ? Can cause constipation. You may  need to take actions to prevent or treat constipation, such as:  Drink enough fluid to keep your urine pale  yellow.  Take over-the-counter or prescription medicines.  Eat foods that are high in fiber, such as beans, whole grains, and fresh fruits and vegetables.  Limit foods that are high in fat and processed sugars, such as fried or sweet foods. Incision care      Follow instructions from your health care provider about how to take care of your incision. Make sure you: ? Wash your hands with soap and water before and after you change your bandage (dressing). If soap and water are not available, use hand sanitizer. ? Change your dressing as told by your health care provider. ? Leave stitches (sutures), skin glue, or adhesive strips in place. These skin closures may need to stay in place for 2 weeks or longer. If adhesive strip edges start to loosen and curl up, you may trim the loose edges. Do not remove adhesive strips completely unless your health care provider tells you to do that.  Check your incision area every day for signs of infection. Check for: ? Redness, swelling, or pain. ? Fluid or blood. ? Warmth. ? Pus or a bad smell. Activity  Rest as told by your health care provider.  Avoid sitting for a long time without moving. Get up to take short walks every 1-2 hours. This is important to improve blood flow and breathing. Ask for help if you feel weak or unsteady.  Return to your normal activities as told by your health care provider. Ask your health care provider what activities are safe for you. General instructions  Do not take baths, swim, or use a hot tub until your health care provider approves. Ask your health care provider if you may take showers. You may only be allowed to take sponge baths.  Have someone help you with your daily household tasks for the first few days.  Keep all follow-up visits as told by your health care provider. This is important. Contact a  health care provider if:  You have redness, swelling, or pain around your incision.  Your incision feels warm to the touch.  You have pus or a bad smell coming from your incision.  The edges of your incision break open after the sutures have been removed.  Your pain does not improve after 2-3 days.  You have a rash.  You repeatedly become dizzy or light-headed.  Your pain medicine is not helping. Get help right away if you:  Have a fever.  Faint.  Have increasing pain in your abdomen.  Have severe pain in one or both of your shoulders.  Have fluid or blood coming from your sutures or from your vagina.  Have shortness of breath or difficulty breathing.  Have chest pain or leg pain.  Have ongoing nausea, vomiting, or diarrhea. Summary  After the procedure, it is common to have mild discomfort or cramping in your abdomen.  Take over-the-counter and prescription medicines only as told by your health care provider.  Watch for symptoms that should prompt you to call your health care provider.  Keep all follow-up visits as told by your health care provider. This is important. This information is not intended to replace advice given to you by your health care provider. Make sure you discuss any questions you have with your health care provider. Document Revised: 06/09/2018 Document Reviewed: 11/25/2017 Elsevier Patient Education  2020 [employer] Inc.  General Anesthesia, Adult, Care After This sheet gives you information about how to care for yourself after your procedure. Your health care provider may  also give you more specific instructions. If you have problems or questions, contact your health care provider. What can I expect after the procedure? After the procedure, the following side effects are common:  Pain or discomfort at the IV site.  Nausea.  Vomiting.  Sore throat.  Trouble concentrating.  Feeling cold or chills.  Weak or tired.  Sleepiness and  fatigue.  Soreness and body aches. These side effects can affect parts of the body that were not involved in surgery. Follow these instructions at home:  For at least 24 hours after the procedure:  Have a responsible adult stay with you. It is important to have someone help care for you until you are awake and alert.  Rest as needed.  Do not: ? Participate in activities in which you could fall or become injured. ? Drive. ? Use heavy machinery. ? Drink alcohol. ? Take sleeping pills or medicines that cause drowsiness. ? Make important decisions or sign legal documents. ? Take care of children on your own. Eating and drinking  Follow any instructions from your health care provider about eating or drinking restrictions.  When you feel hungry, start by eating small amounts of foods that are soft and easy to digest (bland), such as toast. Gradually return to your regular diet.  Drink enough fluid to keep your urine pale yellow.  If you vomit, rehydrate by drinking water, juice, or clear broth. General instructions  If you have sleep apnea, surgery and certain medicines can increase your risk for breathing problems. Follow instructions from your health care provider about wearing your sleep device: ? Anytime you are sleeping, including during daytime naps. ? While taking prescription pain medicines, sleeping medicines, or medicines that make you drowsy.  Return to your normal activities as told by your health care provider. Ask your health care provider what activities are safe for you.  Take over-the-counter and prescription medicines only as told by your health care provider.  If you smoke, do not smoke without supervision.  Keep all follow-up visits as told by your health care provider. This is important. Contact a health care provider if:  You have nausea or vomiting that does not get better with medicine.  You cannot eat or drink without vomiting.  You have pain that  does not get better with medicine.  You are unable to pass urine.  You develop a skin rash.  You have a fever.  You have redness around your IV site that gets worse. Get help right away if:  You have difficulty breathing.  You have chest pain.  You have blood in your urine or stool, or you vomit blood. Summary  After the procedure, it is common to have a sore throat or nausea. It is also common to feel tired.  Have a responsible adult stay with you for the first 24 hours after general anesthesia. It is important to have someone help care for you until you are awake and alert.  When you feel hungry, start by eating small amounts of foods that are soft and easy to digest (bland), such as toast. Gradually return to your regular diet.  Drink enough fluid to keep your urine pale yellow.  Return to your normal activities as told by your health care provider. Ask your health care provider what activities are safe for you. This information is not intended to replace advice given to you by your health care provider. Make sure you discuss any questions you have with your health  care provider. Document Revised: 01/03/2017 Document Reviewed: 08/16/2016 Elsevier Patient Education  [place].

## 2019-07-15 ENCOUNTER — Other Ambulatory Visit: Payer: Self-pay

## 2019-07-15 ENCOUNTER — Encounter (HOSPITAL_COMMUNITY)
Admission: RE | Admit: 2019-07-15 | Discharge: 2019-07-15 | Disposition: A | Discharge status: Home / Self Care | Payer: Medicaid Other | Source: Ambulatory Visit | Attending: Obstetrics & Gynecology | Admitting: Obstetrics & Gynecology

## 2019-07-15 ENCOUNTER — Encounter (HOSPITAL_COMMUNITY): Payer: Self-pay

## 2019-07-15 DIAGNOSIS — Z01818 Encounter for other preprocedural examination: Secondary | ICD-10-CM | POA: Insufficient documentation

## 2019-07-15 HISTORY — DX: Essential (primary) hypertension: I10

## 2019-07-15 LAB — COMPREHENSIVE METABOLIC PANEL
ALT: 22 U/L (ref 0–44)
AST: 20 U/L (ref 15–41)
Albumin: 3.6 g/dL (ref 3.5–5.0)
Alkaline Phosphatase: 93 U/L (ref 38–126)
Anion gap: 8 (ref 5–15)
BUN: 11 mg/dL (ref 6–20)
CO2: 21 mmol/L — ABNORMAL LOW (ref 22–32)
Calcium: 8.4 mg/dL — ABNORMAL LOW (ref 8.9–10.3)
Chloride: 108 mmol/L (ref 98–111)
Creatinine, Ser: 0.74 mg/dL (ref 0.44–1.00)
GFR calc Af Amer: >60 mL/min (ref >60)
GFR calc non Af Amer: >60 mL/min (ref >60)
Glucose, Bld: 88 mg/dL (ref 70–99)
Potassium: 3.6 mmol/L (ref 3.5–5.1)
Sodium: 137 mmol/L (ref 135–145)
Total Bilirubin: 0.5 mg/dL (ref 0.3–1.2)
Total Protein: 7.1 g/dL (ref 6.5–8.1)

## 2019-07-15 LAB — URINALYSIS, ROUTINE W REFLEX MICROSCOPIC
Bilirubin Urine: NEGATIVE
Glucose, UA: NEGATIVE mg/dL
Hgb urine dipstick: NEGATIVE
Ketones, ur: NEGATIVE mg/dL
Nitrite: NEGATIVE
Protein, ur: NEGATIVE mg/dL
Specific Gravity, Urine: 1.021 (ref 1.005–1.030)
pH: 5 (ref 5.0–8.0)

## 2019-07-15 LAB — HCG, QUANTITATIVE, PREGNANCY: hCG, Beta Chain, Quant, S: 1 m[IU]/mL (ref <5)

## 2019-07-15 LAB — CBC
HCT: 38.5 % (ref 36.0–46.0)
Hemoglobin: 12 g/dL (ref 12.0–15.0)
MCH: 27.9 pg (ref 26.0–34.0)
MCHC: 31.2 g/dL (ref 30.0–36.0)
MCV: 89.5 fL (ref 80.0–100.0)
Platelets: 326 10*3/uL (ref 150–400)
RBC: 4.3 MIL/uL (ref 3.87–5.11)
RDW: 14.2 % (ref 11.5–15.5)
WBC: 6.7 10*3/uL (ref 4.0–10.5)
nRBC: 0 % (ref 0.0–0.2)

## 2019-07-15 LAB — RAPID HIV SCREEN (HIV 1/2 AB+AG)
HIV 1/2 Antibodies: NONREACTIVE
HIV-1 P24 Antigen - HIV24: NONREACTIVE

## 2019-07-20 ENCOUNTER — Other Ambulatory Visit (HOSPITAL_COMMUNITY)
Admission: RE | Admit: 2019-07-20 | Discharge: 2019-07-20 | Disposition: A | Discharge status: Home / Self Care | Payer: Medicaid Other | Source: Ambulatory Visit | Attending: Obstetrics & Gynecology | Admitting: Obstetrics & Gynecology

## 2019-07-20 ENCOUNTER — Other Ambulatory Visit: Payer: Self-pay

## 2019-07-20 DIAGNOSIS — Z01812 Encounter for preprocedural laboratory examination: Secondary | ICD-10-CM | POA: Insufficient documentation

## 2019-07-20 DIAGNOSIS — Z20822 Contact with and (suspected) exposure to covid-19: Secondary | ICD-10-CM | POA: Insufficient documentation

## 2019-07-20 LAB — SARS CORONAVIRUS 2 (TAT 6-24 HRS): SARS Coronavirus 2: NEGATIVE

## 2019-07-21 ENCOUNTER — Ambulatory Visit (HOSPITAL_COMMUNITY): Payer: Medicaid Other | Admitting: Anesthesiology

## 2019-07-21 ENCOUNTER — Encounter (HOSPITAL_COMMUNITY): Payer: Self-pay | Admitting: Obstetrics & Gynecology

## 2019-07-21 ENCOUNTER — Ambulatory Visit (HOSPITAL_COMMUNITY)
Admission: RE | Admit: 2019-07-21 | Discharge: 2019-07-21 | Disposition: A | Discharge status: Home / Self Care | Payer: Medicaid Other | Source: Ambulatory Visit | Attending: Obstetrics & Gynecology | Admitting: Obstetrics & Gynecology

## 2019-07-21 ENCOUNTER — Encounter (HOSPITAL_COMMUNITY)
Admission: RE | Disposition: A | Discharge status: Home / Self Care | Payer: Self-pay | Source: Ambulatory Visit | Attending: Obstetrics & Gynecology

## 2019-07-21 DIAGNOSIS — N921 Excessive and frequent menstruation with irregular cycle: Secondary | ICD-10-CM | POA: Insufficient documentation

## 2019-07-21 DIAGNOSIS — Z809 Family history of malignant neoplasm, unspecified: Secondary | ICD-10-CM | POA: Insufficient documentation

## 2019-07-21 DIAGNOSIS — Z833 Family history of diabetes mellitus: Secondary | ICD-10-CM | POA: Insufficient documentation

## 2019-07-21 DIAGNOSIS — Z886 Allergy status to analgesic agent status: Secondary | ICD-10-CM | POA: Insufficient documentation

## 2019-07-21 DIAGNOSIS — I10 Essential (primary) hypertension: Secondary | ICD-10-CM | POA: Diagnosis not present

## 2019-07-21 DIAGNOSIS — Z79899 Other long term (current) drug therapy: Secondary | ICD-10-CM | POA: Insufficient documentation

## 2019-07-21 DIAGNOSIS — Z8249 Family history of ischemic heart disease and other diseases of the circulatory system: Secondary | ICD-10-CM | POA: Diagnosis not present

## 2019-07-21 DIAGNOSIS — Z302 Encounter for sterilization: Secondary | ICD-10-CM

## 2019-07-21 HISTORY — PX: LAPAROSCOPIC BILATERAL SALPINGECTOMY: SHX5889

## 2019-07-21 SURGERY — SALPINGECTOMY, BILATERAL, LAPAROSCOPIC
Anesthesia: General | Laterality: Bilateral

## 2019-07-21 MED ORDER — PROPOFOL 10 MG/ML IV BOLUS
INTRAVENOUS | Status: DC | PRN
  Administered 2019-07-21: 200 mg via INTRAVENOUS

## 2019-07-21 MED ORDER — ONDANSETRON 8 MG PO TBDP
8.0000 mg | ORAL_TABLET | Freq: Three times a day (TID) | ORAL | 0 refills | Status: DC | PRN
Start: 2019-07-21 — End: 2020-06-08

## 2019-07-21 MED ORDER — DEXAMETHASONE SODIUM PHOSPHATE 10 MG/ML IJ SOLN
INTRAMUSCULAR | Status: DC | PRN
  Administered 2019-07-21: 10 mg via INTRAVENOUS

## 2019-07-21 MED ORDER — LIDOCAINE 2% (20 MG/ML) 5 ML SYRINGE
INTRAMUSCULAR | Status: AC
  Filled 2019-07-21: qty 5

## 2019-07-21 MED ORDER — FENTANYL CITRATE (PF) 100 MCG/2ML IJ SOLN
INTRAMUSCULAR | Status: AC
  Filled 2019-07-21: qty 2

## 2019-07-21 MED ORDER — CEFAZOLIN SODIUM-DEXTROSE 2-4 GM/100ML-% IV SOLN
2.0000 g | INTRAVENOUS | Status: AC
  Administered 2019-07-21: 2 g via INTRAVENOUS
  Filled 2019-07-21: qty 100

## 2019-07-21 MED ORDER — SODIUM CHLORIDE 0.9 % IR SOLN
Status: DC | PRN
  Administered 2019-07-21: 1000 mL

## 2019-07-21 MED ORDER — MIDAZOLAM HCL 5 MG/5ML IJ SOLN
INTRAMUSCULAR | Status: DC | PRN
  Administered 2019-07-21: 2 mg via INTRAVENOUS

## 2019-07-21 MED ORDER — ORAL CARE MOUTH RINSE: 15.0000 mL | Freq: Once | OROMUCOSAL | Status: AC

## 2019-07-21 MED ORDER — CHLORHEXIDINE GLUCONATE 0.12 % MT SOLN
15.0000 mL | Freq: Once | OROMUCOSAL | Status: AC
  Administered 2019-07-21: 15 mL via OROMUCOSAL

## 2019-07-21 MED ORDER — MIDAZOLAM HCL 2 MG/2ML IJ SOLN
INTRAMUSCULAR | Status: AC
  Filled 2019-07-21: qty 2

## 2019-07-21 MED ORDER — BUPIVACAINE LIPOSOME 1.3 % IJ SUSP
INTRAMUSCULAR | Status: AC
  Filled 2019-07-21: qty 20

## 2019-07-21 MED ORDER — LACTATED RINGERS IV SOLN: INTRAVENOUS | Status: DC | PRN

## 2019-07-21 MED ORDER — FENTANYL CITRATE (PF) 100 MCG/2ML IJ SOLN
INTRAMUSCULAR | Status: DC | PRN
  Administered 2019-07-21 (×3): 50 ug via INTRAVENOUS

## 2019-07-21 MED ORDER — HYDROCODONE-ACETAMINOPHEN 5-325 MG PO TABS: 1.0000 | ORAL_TABLET | Freq: Four times a day (QID) | ORAL | 0 refills | Status: DC | PRN

## 2019-07-21 MED ORDER — ROCURONIUM BROMIDE 100 MG/10ML IV SOLN
INTRAVENOUS | Status: DC | PRN
  Administered 2019-07-21: 40 mg via INTRAVENOUS

## 2019-07-21 MED ORDER — SUGAMMADEX SODIUM 500 MG/5ML IV SOLN
INTRAVENOUS | Status: AC
  Filled 2019-07-21: qty 5

## 2019-07-21 MED ORDER — BUPIVACAINE LIPOSOME 1.3 % IJ SUSP
20.0000 mL | Freq: Once | INTRAMUSCULAR | Status: DC
  Filled 2019-07-21: qty 20

## 2019-07-21 MED ORDER — HYDROMORPHONE HCL 1 MG/ML IJ SOLN: 0.2500 mg | INTRAMUSCULAR | Status: DC | PRN

## 2019-07-21 MED ORDER — LIDOCAINE 2% (20 MG/ML) 5 ML SYRINGE
INTRAMUSCULAR | Status: DC | PRN
  Administered 2019-07-21: 80 mg via INTRAVENOUS

## 2019-07-21 MED ORDER — KETOROLAC TROMETHAMINE 30 MG/ML IJ SOLN
30.0000 mg | Freq: Once | INTRAMUSCULAR | Status: AC
  Administered 2019-07-21: 30 mg via INTRAVENOUS
  Filled 2019-07-21: qty 1

## 2019-07-21 MED ORDER — BUPIVACAINE LIPOSOME 1.3 % IJ SUSP
INTRAMUSCULAR | Status: DC | PRN
  Administered 2019-07-21: 20 mL

## 2019-07-21 MED ORDER — SUGAMMADEX SODIUM 200 MG/2ML IV SOLN
INTRAVENOUS | Status: DC | PRN
  Administered 2019-07-21: 150 mg via INTRAVENOUS

## 2019-07-21 MED ORDER — ONDANSETRON HCL 4 MG/2ML IJ SOLN
INTRAMUSCULAR | Status: DC | PRN
  Administered 2019-07-21: 4 mg via INTRAVENOUS

## 2019-07-21 MED ORDER — PROMETHAZINE HCL 25 MG/ML IJ SOLN: 6.2500 mg | INTRAMUSCULAR | Status: DC | PRN

## 2019-07-21 MED ORDER — ROCURONIUM BROMIDE 10 MG/ML (PF) SYRINGE
PREFILLED_SYRINGE | INTRAVENOUS | Status: AC
  Filled 2019-07-21: qty 10

## 2019-07-21 MED ORDER — MEPERIDINE HCL 50 MG/ML IJ SOLN: 6.2500 mg | INTRAMUSCULAR | Status: DC | PRN

## 2019-07-21 MED ORDER — LACTATED RINGERS IV SOLN
Freq: Once | INTRAVENOUS | Status: AC
  Administered 2019-07-21: 1000 mL via INTRAVENOUS

## 2019-07-21 MED ORDER — POVIDONE-IODINE 10 % EX SWAB: 2.0000 "application " | Freq: Once | CUTANEOUS | Status: DC

## 2019-07-21 MED ORDER — DEXAMETHASONE SODIUM PHOSPHATE 10 MG/ML IJ SOLN
INTRAMUSCULAR | Status: AC
  Filled 2019-07-21: qty 1

## 2019-07-21 MED ORDER — PROPOFOL 10 MG/ML IV BOLUS
INTRAVENOUS | Status: AC
  Filled 2019-07-21: qty 20

## 2019-07-21 MED ORDER — ONDANSETRON HCL 4 MG/2ML IJ SOLN
INTRAMUSCULAR | Status: AC
  Filled 2019-07-21: qty 2

## 2019-07-21 SURGICAL SUPPLY — 38 items
BAG HAMPER (MISCELLANEOUS) ×3 IMPLANT
BLADE SURG SZ11 CARB STEEL (BLADE) ×3 IMPLANT
CLOTH BEACON ORANGE TIMEOUT ST (SAFETY) ×3 IMPLANT
COVER LIGHT HANDLE STERIS (MISCELLANEOUS) ×6 IMPLANT
COVER WAND RF STERILE (DRAPES) ×6 IMPLANT
DERMABOND ADVANCED (GAUZE/BANDAGES/DRESSINGS) ×2
DERMABOND ADVANCED .7 DNX12 (GAUZE/BANDAGES/DRESSINGS) ×1 IMPLANT
ELECT REM PT RETURN 9FT ADLT (ELECTROSURGICAL) ×3
ELECTRODE REM PT RTRN 9FT ADLT (ELECTROSURGICAL) ×1 IMPLANT
GAUZE 4X4 16PLY RFD (DISPOSABLE) ×3 IMPLANT
GLOVE BIOGEL PI IND STRL 7.0 (GLOVE) ×3 IMPLANT
GLOVE BIOGEL PI IND STRL 8 (GLOVE) ×1 IMPLANT
GLOVE BIOGEL PI INDICATOR 7.0 (GLOVE) ×6
GLOVE BIOGEL PI INDICATOR 8 (GLOVE) ×2
GLOVE ECLIPSE 7.0 STRL STRAW (GLOVE) ×6 IMPLANT
GLOVE ECLIPSE 8.0 STRL XLNG CF (GLOVE) ×3 IMPLANT
GOWN STRL REUS W/TWL LRG LVL3 (GOWN DISPOSABLE) ×3 IMPLANT
GOWN STRL REUS W/TWL XL LVL3 (GOWN DISPOSABLE) ×3 IMPLANT
INST SET LAPROSCOPIC GYN AP (KITS) ×3 IMPLANT
KIT TURNOVER CYSTO (KITS) ×3 IMPLANT
NEEDLE HYPO 18GX1.5 BLUNT FILL (NEEDLE) ×3 IMPLANT
NEEDLE HYPO 22GX1.5 SAFETY (NEEDLE) ×3 IMPLANT
NEEDLE INSUFFLATION 14GA 120MM (NEEDLE) ×3 IMPLANT
PACK PERI GYN (CUSTOM PROCEDURE TRAY) ×3 IMPLANT
PAD ARMBOARD 7.5X6 YLW CONV (MISCELLANEOUS) ×3 IMPLANT
SET BASIN LINEN APH (SET/KITS/TRAYS/PACK) ×3 IMPLANT
SET TUBE SMOKE EVAC HIGH FLOW (TUBING) ×3 IMPLANT
SHEARS HARMONIC ACE PLUS 36CM (ENDOMECHANICALS) ×3 IMPLANT
SLEEVE ENDOPATH XCEL 5M (ENDOMECHANICALS) ×3 IMPLANT
SOL ANTI FOG 6CC (MISCELLANEOUS) ×1 IMPLANT
SOLUTION ANTI FOG 6CC (MISCELLANEOUS) ×2
SUT VICRYL 0 UR6 27IN ABS (SUTURE) ×3 IMPLANT
SUT VICRYL AB 3-0 FS1 BRD 27IN (SUTURE) ×3 IMPLANT
SYR 10ML LL (SYRINGE) ×3 IMPLANT
SYR 20ML LL LF (SYRINGE) ×6 IMPLANT
TROCAR ENDO BLADELESS 11MM (ENDOMECHANICALS) ×3 IMPLANT
TROCAR XCEL NON-BLD 5MMX100MML (ENDOMECHANICALS) ×3 IMPLANT
WARMER LAPAROSCOPE (MISCELLANEOUS) ×3 IMPLANT

## 2019-07-21 NOTE — Anesthesia Postprocedure Evaluation (Signed)
Anesthesia Post Note  Patient: Jennifer Aguilar  Procedure(s) Performed: LAPAROSCOPIC BILATERAL SALPINGECTOMY FOR STERILIZATION (Bilateral )  Patient location during evaluation: PACU Anesthesia Type: General Level of consciousness: awake and alert and oriented Pain management: pain level controlled Vital Signs Assessment: post-procedure vital signs reviewed and stable Respiratory status: spontaneous breathing and respiratory function stable Cardiovascular status: blood pressure returned to baseline Postop Assessment: no apparent nausea or vomiting Anesthetic complications: no   No complications documented.   Last Vitals:  Vitals:   07/21/19 1115 07/21/19 1139  BP: 121/90 (!) 127/92  Pulse: 65 71  Resp: 13 13  Temp:  36.5 C  SpO2: 100% 99%    Last Pain:  Vitals:   07/21/19 1139  TempSrc: Oral  PainSc: 3                  Zailee Vallely C [place] Crance

## 2019-07-21 NOTE — H&P (Signed)
Preoperative History and Physical Jennifer Aguilar is a 24 y.o. [phone] with No LMP recorded. (Menstrual status: Lactating). admitted for a laparoscopic bilateral salpingectomy for the purpose of permanent sterilization.  07/21/19   PMH:    Past Medical History:  Diagnosis Date  . Hypertension   . Menometrorrhagia 08/29/2014  . Seasonal allergies     PSH:     Past Surgical History:  Procedure Laterality Date  . IUD REMOVAL    . LAPAROSCOPY N/A 09/01/2018   Procedure: LAPAROSCOPY DIAGNOSTIC, REMOVAL OF INTRAPERITONEAL INTRAUTERINE DEVICE;  Surgeon: Tilda Burrow, MD;  Location: [facility];  Service: Gynecology;  Laterality: N/A;  . NO PAST SURGERIES      POb/GynH:      OB History    Gravida  4   Para  4   Term  4   Preterm      AB      Living  4     SAB      TAB      Ectopic      Multiple  0   Live Births  4           SH:   Social History   Tobacco Use  . Smoking status: Never Smoker  . Smokeless tobacco: Never Used  Vaping Use  . Vaping Use: Never used  Substance Use Topics  . Alcohol use: No  . Drug use: No    FH:    Family History  Problem Relation Age of Onset  . Diabetes Mother   . Hypertension Mother   . Hypertension Father   . Heart failure Maternal Grandfather   . Hypertension Sister   . Hypertension Paternal Grandfather   . Heart failure Maternal Grandmother   . Diabetes Maternal Grandmother   . Cancer Paternal Aunt   . Hypertension Paternal Uncle      Allergies:  Allergies  Allergen Reactions  . Meloxicam     REACTION: increased heart rate, body numbness; tolerates ibuprofen, per pt    Medications:       Current Facility-Administered Medications:  .  bupivacaine liposome (EXPAREL) 1.3 % injection 266 mg, 20 mL, Infiltration, Once, Orlan Aversa H, MD .  ceFAZolin (ANCEF) IVPB 2g/100 mL premix, 2 g, Intravenous, On Call to OR, Lazaro Arms, MD .  povidone-iodine 10 % swab 2 application, 2 application, Topical, Once, Lazaro Arms, MD  Review of Systems:   Review of Systems  Constitutional: Negative for fever, chills, weight loss, malaise/fatigue and diaphoresis.  HENT: Negative for hearing loss, ear pain, nosebleeds, congestion, sore throat, neck pain, tinnitus and ear discharge.   Eyes: Negative for blurred vision, double vision, photophobia, pain, discharge and redness.  Respiratory: Negative for cough, hemoptysis, sputum production, shortness of breath, wheezing and stridor.   Cardiovascular: Negative for chest pain, palpitations, orthopnea, claudication, leg swelling and PND.  Gastrointestinal: Positive for abdominal pain. Negative for heartburn, nausea, vomiting, diarrhea, constipation, blood in stool and melena.  Genitourinary: Negative for dysuria, urgency, frequency, hematuria and flank pain.  Musculoskeletal: Negative for myalgias, back pain, joint pain and falls.  Skin: Negative for itching and rash.  Neurological: Negative for dizziness, tingling, tremors, sensory change, speech change, focal weakness, seizures, loss of consciousness, weakness and headaches.  Endo/Heme/Allergies: Negative for environmental allergies and polydipsia. Does not bruise/bleed easily.  Psychiatric/Behavioral: Negative for depression, suicidal ideas, hallucinations, memory loss and substance abuse. The patient is not nervous/anxious and does not have insomnia.  PHYSICAL EXAM:  Blood pressure 128/82, pulse 79, temperature 98.8 F (37.1 C), temperature source Oral, resp. rate 17, SpO2 99 %, not currently breastfeeding.    Vitals reviewed. Constitutional: She is oriented to person, place, and time. She appears well-developed and well-nourished.  HENT:  Head: Normocephalic and atraumatic.  Right Ear: External ear normal.  Left Ear: External ear normal.  Nose: Nose normal.  Mouth/Throat: Oropharynx is clear and moist.  Eyes: Conjunctivae and EOM are normal. Pupils are equal, round, and reactive to light. Right eye  exhibits no discharge. Left eye exhibits no discharge. No scleral icterus.  Neck: Normal range of motion. Neck supple. No tracheal deviation present. No thyromegaly present.  Cardiovascular: Normal rate, regular rhythm, normal heart sounds and intact distal pulses.  Exam reveals no gallop and no friction rub.   No murmur heard. Respiratory: Effort normal and breath sounds normal. No respiratory distress. She has no wheezes. She has no rales. She exhibits no tenderness.  GI: Soft. Bowel sounds are normal. She exhibits no distension and no mass. There is tenderness. There is no rebound and no guarding.  Genitourinary:       Vulva is normal without lesions Vagina is pink moist without discharge Cervix normal in appearance and pap is normal Uterus is normal size, contour, position, consistency, mobility, non-tender Adnexa is negative with normal sized ovaries by sonogram  Musculoskeletal: Normal range of motion. She exhibits no edema and no tenderness.  Neurological: She is alert and oriented to person, place, and time. She has normal reflexes. She displays normal reflexes. No cranial nerve deficit. She exhibits normal muscle tone. Coordination normal.  Skin: Skin is warm and dry. No rash noted. No erythema. No pallor.  Psychiatric: She has a normal mood and affect. Her behavior is normal. Judgment and thought content normal.    Labs: Results for orders placed or performed during the hospital encounter of 07/20/19 (from the past 336 hour(s))  SARS CORONAVIRUS 2 (TAT 6-24 HRS) Nasopharyngeal Nasopharyngeal Swab   Collection Time: 07/20/19  8:21 AM   Specimen: Nasopharyngeal Swab  Result Value Ref Range   SARS Coronavirus 2 NEGATIVE NEGATIVE  Results for orders placed or performed during the hospital encounter of 07/15/19 (from the past 336 hour(s))  Urinalysis, Routine w reflex microscopic   Collection Time: 07/15/19  1:19 PM  Result Value Ref Range   Color, Urine YELLOW YELLOW   APPearance  HAZY (A) CLEAR   Specific Gravity, Urine 1.021 1.005 - 1.030   pH 5.0 5.0 - 8.0   Glucose, UA NEGATIVE NEGATIVE mg/dL   Hgb urine dipstick NEGATIVE NEGATIVE   Bilirubin Urine NEGATIVE NEGATIVE   Ketones, ur NEGATIVE NEGATIVE mg/dL   Protein, ur NEGATIVE NEGATIVE mg/dL   Nitrite NEGATIVE NEGATIVE   Leukocytes,Ua SMALL (A) NEGATIVE   RBC / HPF 6-10 0 - 5 RBC/hpf   WBC, UA 6-10 0 - 5 WBC/hpf   Bacteria, UA RARE (A) NONE SEEN   Squamous Epithelial / LPF 0-5 0 - 5   Mucus PRESENT   CBC   Collection Time: 07/15/19  1:20 PM  Result Value Ref Range   WBC 6.7 4.0 - 10.5 K/uL   RBC 4.30 3.87 - 5.11 MIL/uL   Hemoglobin 12.0 12.0 - 15.0 g/dL   HCT 19.4 36 - 46 %   MCV 89.5 80.0 - 100.0 fL   MCH 27.9 26.0 - 34.0 pg   MCHC 31.2 30.0 - 36.0 g/dL   RDW 17.4 08.1 - 44.8 %  Platelets 326 150 - 400 K/uL   nRBC 0.0 0.0 - 0.2 %  Comprehensive metabolic panel   Collection Time: 07/15/19  1:20 PM  Result Value Ref Range   Sodium 137 135 - 145 mmol/L   Potassium 3.6 3.5 - 5.1 mmol/L   Chloride 108 98 - 111 mmol/L   CO2 21 (L) 22 - 32 mmol/L   Glucose, Bld 88 70 - 99 mg/dL   BUN 11 6 - 20 mg/dL   Creatinine, Ser 4.26 0.44 - 1.00 mg/dL   Calcium 8.4 (L) 8.9 - 10.3 mg/dL   Total Protein 7.1 6.5 - 8.1 g/dL   Albumin 3.6 3.5 - 5.0 g/dL   AST 20 15 - 41 U/L   ALT 22 0 - 44 U/L   Alkaline Phosphatase 93 38 - 126 U/L   Total Bilirubin 0.5 0.3 - 1.2 mg/dL   GFR calc non Af Amer >60 >60 mL/min   GFR calc Af Amer >60 >60 mL/min   Anion gap 8 5 - 15  hCG, quantitative, pregnancy   Collection Time: 07/15/19  1:20 PM  Result Value Ref Range   hCG, Beta Chain, Quant, S 1 <5 mIU/mL  Rapid HIV screen (HIV 1/2 Ab+Ag)   Collection Time: 07/15/19  1:20 PM  Result Value Ref Range   HIV-1 P24 Antigen - HIV24 NON REACTIVE NON REACTIVE   HIV 1/2 Antibodies NON REACTIVE NON REACTIVE   Interpretation (HIV Ag Ab)      A non reactive test result means that HIV 1 or HIV 2 antibodies and HIV 1 p24 antigen were  not detected in the specimen.    EKG: Orders placed or performed during the hospital encounter of 07/15/19  . EKG 12-Lead  . EKG 12-Lead    Imaging Studies: No results found.    Assessment: Multiparous female desires permanent sterilization Request salpingectomy as specific sterilization procedure  Plan: Laparoscopic salpingectomy for the purpose of permanent sterilization  Lazaro Arms 07/21/2019 9:28 AM

## 2019-07-21 NOTE — Discharge Instructions (Signed)
General Anesthesia, Adult, Care After This sheet gives you information about how to care for yourself after your procedure. Your health care provider may also give you more specific instructions. If you have problems or questions, contact your health care provider. What can I expect after the procedure? After the procedure, the following side effects are common:  Pain or discomfort at the IV site.  Nausea.  Vomiting.  Sore throat.  Trouble concentrating.  Feeling cold or chills.  Weak or tired.  Sleepiness and fatigue.  Soreness and body aches. These side effects can affect parts of the body that were not involved in surgery. Follow these instructions at home:  For at least 24 hours after the procedure:  Have a responsible adult stay with you. It is important to have someone help care for you until you are awake and alert.  Rest as needed.  Do not: ? Participate in activities in which you could fall or become injured. ? Drive. ? Use heavy machinery. ? Drink alcohol. ? Take sleeping pills or medicines that cause drowsiness. ? Make important decisions or sign legal documents. ? Take care of children on your own. Eating and drinking  Follow any instructions from your health care provider about eating or drinking restrictions.  When you feel hungry, start by eating small amounts of foods that are soft and easy to digest (bland), such as toast. Gradually return to your regular diet.  Drink enough fluid to keep your urine pale yellow.  If you vomit, rehydrate by drinking water, juice, or clear broth. General instructions  If you have sleep apnea, surgery and certain medicines can increase your risk for breathing problems. Follow instructions from your health care provider about wearing your sleep device: ? Anytime you are sleeping, including during daytime naps. ? While taking prescription pain medicines, sleeping medicines, or medicines that make you drowsy.  Return to  your normal activities as told by your health care provider. Ask your health care provider what activities are safe for you.  Take over-the-counter and prescription medicines only as told by your health care provider.  If you smoke, do not smoke without supervision.  Keep all follow-up visits as told by your health care provider. This is important. Contact a health care provider if:  You have nausea or vomiting that does not get better with medicine.  You cannot eat or drink without vomiting.  You have pain that does not get better with medicine.  You are unable to pass urine.  You develop a skin rash.  You have a fever.  You have redness around your IV site that gets worse. Get help right away if:  You have difficulty breathing.  You have chest pain.  You have blood in your urine or stool, or you vomit blood. Summary  After the procedure, it is common to have a sore throat or nausea. It is also common to feel tired.  Have a responsible adult stay with you for the first 24 hours after general anesthesia. It is important to have someone help care for you until you are awake and alert.  When you feel hungry, start by eating small amounts of foods that are soft and easy to digest (bland), such as toast. Gradually return to your regular diet.  Drink enough fluid to keep your urine pale yellow.  Return to your normal activities as told by your health care provider. Ask your health care provider what activities are safe for you. This information is not   intended to replace advice given to you by your health care provider. Make sure you discuss any questions you have with your health care provider. Document Revised: 01/03/2017 Document Reviewed: 08/16/2016 Elsevier Patient Education  2020 [employer] Laparoscopic Tubal Ligation, Care After This sheet gives you information about how to care for yourself after your procedure. Your health care provider may also give you more  specific instructions. If you have problems or questions, contact your health care provider. What can I expect after the procedure? After the procedure, it is common to have:  A sore throat.  Discomfort in your shoulder.  Mild discomfort or cramping in your abdomen.  Gas pains.  Pain or soreness in the area where the surgical incision was made.  A bloated feeling.  Tiredness.  Nausea.  Vomiting. Follow these instructions at home: Medicines  Take over-the-counter and prescription medicines only as told by your health care provider.  Do not take aspirin because it can cause bleeding.  Ask your health care provider if the medicine prescribed to you: ? Requires you to avoid driving or using heavy machinery. ? Can cause constipation. You may need to take actions to prevent or treat constipation, such as:  Drink enough fluid to keep your urine pale yellow.  Take over-the-counter or prescription medicines.  Eat foods that are high in fiber, such as beans, whole grains, and fresh fruits and vegetables.  Limit foods that are high in fat and processed sugars, such as fried or sweet foods. Incision care      Follow instructions from your health care provider about how to take care of your incision. Make sure you: ? Wash your hands with soap and water before and after you change your bandage (dressing). If soap and water are not available, use hand sanitizer. ? Change your dressing as told by your health care provider. ? Leave stitches (sutures), skin glue, or adhesive strips in place. These skin closures may need to stay in place for 2 weeks or longer. If adhesive strip edges start to loosen and curl up, you may trim the loose edges. Do not remove adhesive strips completely unless your health care provider tells you to do that.  Check your incision area every day for signs of infection. Check for: ? Redness, swelling, or pain. ? Fluid or blood. ? Warmth. ? Pus or a bad  smell. Activity  Rest as told by your health care provider.  Avoid sitting for a long time without moving. Get up to take short walks every 1-2 hours. This is important to improve blood flow and breathing. Ask for help if you feel weak or unsteady.  Return to your normal activities as told by your health care provider. Ask your health care provider what activities are safe for you. General instructions  Do not take baths, swim, or use a hot tub until your health care provider approves. Ask your health care provider if you may take showers. You may only be allowed to take sponge baths.  Have someone help you with your daily household tasks for the first few days.  Keep all follow-up visits as told by your health care provider. This is important. Contact a health care provider if:  You have redness, swelling, or pain around your incision.  Your incision feels warm to the touch.  You have pus or a bad smell coming from your incision.  The edges of your incision break open after the sutures have been removed.  Your pain does   severe pain in one or both of your shoulders.  Have fluid or blood coming from your sutures or from your vagina.  Have shortness of breath or difficulty breathing.  Have chest pain or leg pain.  Have ongoing nausea, vomiting, or diarrhea. Summary  After the procedure, it is common to have mild discomfort or cramping in your abdomen.  Take over-the-counter and prescription medicines only as told by your health care provider.  Watch for symptoms that should prompt you to call your health care provider.  Keep all follow-up visits as told by your health care provider. This is important. This information is not intended to replace advice given to you by your health care provider. Make sure you discuss any  questions you have with your health care provider. Document Revised: 06/09/2018 Document Reviewed: 11/25/2017 Elsevier Patient Education  2020 [employer].

## 2019-07-21 NOTE — Anesthesia Preprocedure Evaluation (Signed)
Anesthesia Evaluation  Patient identified by MRN, date of birth, ID band Patient awake    Reviewed: Allergy & Precautions, NPO status , Patient's Chart, lab work & pertinent test results  History of Anesthesia Complications Negative for: history of anesthetic complications  Airway Mallampati: II  TM Distance: >3 FB Neck ROM: Full    Dental  (+) Teeth Intact, Dental Advisory Given   Pulmonary neg pulmonary ROS,    Pulmonary exam normal breath sounds clear to auscultation       Cardiovascular Exercise Tolerance: Good hypertension, Pt. on medications Normal cardiovascular exam Rhythm:Regular Rate:Normal     Neuro/Psych negative neurological ROS  negative psych ROS   GI/Hepatic negative GI ROS, Neg liver ROS,   Endo/Other  negative endocrine ROS  Renal/GU negative Renal ROS     Musculoskeletal negative musculoskeletal ROS (+)   Abdominal   Peds  Hematology negative hematology ROS (+)   Anesthesia Other Findings   Reproductive/Obstetrics negative OB ROS                             Anesthesia Physical Anesthesia Plan  ASA: II  Anesthesia Plan: General   Post-op Pain Management:    Induction: Intravenous  PONV Risk Score and Plan: Ondansetron, Dexamethasone and Midazolam  Airway Management Planned: Oral ETT  Additional Equipment:   Intra-op Plan:   Post-operative Plan: Extubation in OR  Informed Consent: I have reviewed the patients History and Physical, chart, labs and discussed the procedure including the risks, benefits and alternatives for the proposed anesthesia with the patient or authorized representative who has indicated his/her understanding and acceptance.     Dental advisory given  Plan Discussed with: CRNA and Surgeon  Anesthesia Plan Comments:        Anesthesia Quick Evaluation

## 2019-07-21 NOTE — Anesthesia Procedure Notes (Signed)
Procedure Name: Intubation Date/Time: 07/21/2019 9:59 AM Performed by: Marny Lowenstein, CRNA Pre-anesthesia Checklist: Patient identified, Emergency Drugs available, Suction available and Patient being monitored Patient Re-evaluated:Patient Re-evaluated prior to induction Oxygen Delivery Method: Circle system utilized Preoxygenation: Pre-oxygenation with 100% oxygen Induction Type: IV induction Ventilation: Mask ventilation without difficulty Laryngoscope Size: Miller and 2 Grade View: Grade I Tube type: Oral Tube size: 7.0 mm Number of attempts: 1 Airway Equipment and Method: Patient positioned with wedge pillow and Stylet Placement Confirmation: ETT inserted through vocal cords under direct vision,  positive ETCO2 and breath sounds checked- equal and bilateral Secured at: 21 cm Tube secured with: Tape Dental Injury: Teeth and Oropharynx as per pre-operative assessment

## 2019-07-21 NOTE — Op Note (Signed)
Preoperative Diagnosis:  1.  Multiparous female desires permanent sterilization                                          2.  Elects to have bilateral salpingectomy for ovarian cancer prophylaxis  Postoperative Diagnosis:  Same as above  Procedure:  Laparoscopic Bilateral Salpingectomy for the purpose of permanent sterilization  Surgeon:  Rockne Coons MD  Anaesthesia: general  Findings:  Patient had normal pelvic anatomy and no intraperitoneal abnormalities.  Description of Operation:  Patient was taken to the OR and placed into supine position where she underwent general anaesthesia.   She was placed in the dorsal lithotomy position and prepped and draped in the usual sterile fashion.   An incision was made in the umbilicus and dissection taken down to the rectus fascia. A Veres needle was used to insufflate the periotneal cavity. An 11 mm non bladed video laparoscope trocar was then placed under direct visualization without difficulty.   The above noted findings were observed.   Two additional 5 mm non bladed trocars were placed in the right and left lower quadrants under direct visualization without difficulty.   The Harmonic scalpel was employed and a salpingectomy of both the right and left tubes was performed.   The tubes were removed from the peritoneal cavity and sent to pathology.   There was good hemostasis bilaterally.   The fascia, peritoneum and subcutaneous tissue were closed using 0 vicryl.   All 3 skin incisions were closed using 3-0 vicryl in a subcuticular fashion.  Exparel 266 mg 20 cc was injected in the 3 incisional/trocar sites.  The patient was awakened from anaesthesia and taken to the PACU with all counts being correct x 3.   The patient received  2 gram of ancef andToradol 30 mg IV preoperatively.  Lazaro Arms 07/21/2019 10:38 AM

## 2019-07-21 NOTE — Transfer of Care (Signed)
Immediate Anesthesia Transfer of Care Note  Patient: Jennifer Aguilar  Procedure(s) Performed: LAPAROSCOPIC BILATERAL SALPINGECTOMY FOR STERILIZATION (Bilateral )  Patient Location: PACU  Anesthesia Type:General  Level of Consciousness: drowsy  Airway & Oxygen Therapy: Patient Spontanous Breathing and Patient connected to nasal cannula oxygen  Post-op Assessment: Report given to RN and Post -op Vital signs reviewed and stable  Post vital signs: Reviewed and stable  Last Vitals:  Vitals Value Taken Time  BP 129/89 07/21/19 1051  Temp    Pulse 71 07/21/19 1052  Resp 13 07/21/19 1052  SpO2 100 % 07/21/19 1052  Vitals shown include unvalidated device data.  Last Pain:  Vitals:   07/21/19 0851  TempSrc: Oral  PainSc: 0-No pain      Patients Stated Pain Goal: 7 (07/21/19 0851)  Complications: No complications documented.

## 2019-07-22 ENCOUNTER — Other Ambulatory Visit: Payer: Self-pay | Admitting: Family Medicine

## 2019-07-22 ENCOUNTER — Encounter (HOSPITAL_COMMUNITY): Payer: Self-pay | Admitting: Obstetrics & Gynecology

## 2019-07-22 LAB — SURGICAL PATHOLOGY

## 2019-07-29 ENCOUNTER — Telehealth: Payer: Medicaid Other | Admitting: Obstetrics & Gynecology

## 2019-07-29 ENCOUNTER — Telehealth: Payer: Medicaid Other | Admitting: Advanced Practice Midwife

## 2019-08-11 ENCOUNTER — Other Ambulatory Visit: Payer: Self-pay | Admitting: Family Medicine

## 2019-09-02 ENCOUNTER — Other Ambulatory Visit: Payer: Self-pay | Admitting: Family Medicine

## 2019-09-08 ENCOUNTER — Ambulatory Visit (INDEPENDENT_AMBULATORY_CARE_PROVIDER_SITE_OTHER): Payer: Medicaid Other | Admitting: Advanced Practice Midwife

## 2019-09-08 ENCOUNTER — Encounter: Payer: Self-pay | Admitting: Advanced Practice Midwife

## 2019-09-08 VITALS — BP 128/89 | HR 92 | Ht 65.0 in | Wt 198.0 lb

## 2019-09-08 DIAGNOSIS — Z789 Other specified health status: Secondary | ICD-10-CM

## 2019-09-08 NOTE — Progress Notes (Signed)
° °  GYN VISIT Patient name: Jennifer Aguilar MRN [id number]  Date of birth: 11-10-1995 Chief Complaint:   discuss weight loss  History of Present Illness:   Jennifer Aguilar is a 24 y.o. [phone] Caucasian female being seen today for a desire for weight loss, specifically phentermine. She reports going to the gym regularly since her baby was born in June 2021, and she fluctuates between 196-200lbs. States she eats twice/day, but isn't aware of her daily calorie consumption.  Depression screen [facility] 2/9 07/08/2019 01/04/2019 05/09/2016 04/24/2016  Decreased Interest 0 0 0 0  Down, Depressed, Hopeless 0 0 0 0  PHQ - 2 Score 0 0 0 0  Altered sleeping 0 0 0 -  Tired, decreased energy 0 0 1 -  Change in appetite 0 0 0 -  Feeling bad or failure about yourself  0 0 0 -  Trouble concentrating 0 0 0 -  Moving slowly or fidgety/restless 0 0 0 -  Suicidal thoughts - 0 0 -  PHQ-9 Score 0 0 1 -    No LMP recorded. The current method of family planning is tubal ligation (bilat salpingectomy on 07/21/19) Last pap Dec 2020. Results were:  abnormal  ASCUS w HR HPV, for repeat in 40yr (Dec 2021) Review of Systems:   Pertinent items are noted in HPI Denies fever/chills, dizziness, headaches, visual disturbances, fatigue, shortness of breath, chest pain, abdominal pain, vomiting, abnormal vaginal discharge/itching/odor/irritation, problems with periods, bowel movements, urination, or intercourse unless otherwise stated above.  Pertinent History Reviewed:  Reviewed past medical,surgical, social, obstetrical and family history.  Reviewed problem list, medications and allergies. Physical Assessment:   Vitals:   09/08/19 1352  BP: 128/89  Pulse: 92  Weight: 198 lb (89.8 kg)  Height: 5\' 5"  (1.651 m)  Body mass index is 32.95 kg/m.       Physical Examination:   General appearance: alert, well appearing, and in no distress  Mental status: alert, oriented to person, place, and time  Skin: warm & dry    Cardiovascular: normal heart rate noted  Respiratory: normal respiratory effort, no distress  Abdomen: soft, non-tender   Pelvic: examination not indicated  Extremities: no edema   Chaperone: n/a    No results found for this or any previous visit (from the past 24 hour(s)).  Assessment & Plan:  1) Requests phentermine for weight loss> will have speak to pt over the phone to review R&Bs (given printed info re phentermine in AVS); chart shared with Cyril Mourning  2) Hx abnl Pap> scheduled for repeat in Dec 2021  Meds: No orders of the defined types were placed in this encounter.   No orders of the defined types were placed in this encounter.   Return in about 14 weeks (around 12/15/2019) for Pap & Physical.  14/01/2019 [facility] 09/08/2019 2:34 PM

## 2019-09-08 NOTE — Patient Instructions (Signed)
Phentermine tablets or capsules What is this medicine? PHENTERMINE (FEN ter meen) decreases your appetite. It is used with a reduced calorie diet and exercise to help you lose weight. This medicine may be used for other purposes; ask your health care provider or pharmacist if you have questions. COMMON BRAND NAME(S): Adipex-P, Atti-Plex P, Atti-Plex P Spansule, Fastin, Lomaira, Pro-Fast, Tara-8 What should I tell my health care provider before I take this medicine? They need to know if you have any of these conditions:  agitation or nervousness  diabetes  glaucoma  heart disease  high blood pressure  history of drug abuse or addiction  history of stroke  kidney disease  lung disease called Primary Pulmonary Hypertension (PPH)  taken an MAOI like Carbex, Eldepryl, Marplan, Nardil, or Parnate in last 14 days  taking stimulant medicines for attention disorders, weight loss, or to stay awake  thyroid disease  an unusual or allergic reaction to phentermine, other medicines, foods, dyes, or preservatives  pregnant or trying to get pregnant  breast-feeding How should I use this medicine? Take this medicine by mouth with a glass of water. Follow the directions on the prescription label. Take your medicine at regular intervals. Do not take it more often than directed. Do not stop taking except on your doctor's advice. Talk to your pediatrician regarding the use of this medicine in children. While this drug may be prescribed for children 17 years or older for selected conditions, precautions do apply. Overdosage: If you think you have taken too much of this medicine contact a poison control center or emergency room at once. NOTE: This medicine is only for you. Do not share this medicine with others. What if I miss a dose? If you miss a dose, take it as soon as you can. If it is almost time for your next dose, take only that dose. Do not take double or extra doses. What may interact  with this medicine? Do not take this medicine with any of the following medications:  MAOIs like Carbex, Eldepryl, Marplan, Nardil, and Parnate This medicine may also interact with the following medications:  alcohol  certain medicines for depression, anxiety, or psychotic disorders  certain medicines for high blood pressure  linezolid  medicines for colds or breathing difficulties like pseudoephedrine or phenylephrine  medicines for diabetes  sibutramine  stimulant medicines for attention disorders, weight loss, or to stay awake This list may not describe all possible interactions. Give your health care provider a list of all the medicines, herbs, non-prescription drugs, or dietary supplements you use. Also tell them if you smoke, drink alcohol, or use illegal drugs. Some items may interact with your medicine. What should I watch for while using this medicine? Visit your doctor or health care provider for regular checks on your progress. Do not stop taking except on your health care provider's advice. You may develop a severe reaction. Your health care provider will tell you how much medicine to take. Do not take this medicine close to bedtime. It may prevent you from sleeping. You may get drowsy or dizzy. Do not drive, use machinery, or do anything that needs mental alertness until you know how this medicine affects you. Do not stand or sit up quickly, especially if you are an older patient. This reduces the risk of dizzy or fainting spells. Alcohol may increase dizziness and drowsiness. Avoid alcoholic drinks. This medicine may affect blood sugar levels. Ask your healthcare provider if changes in diet or medicines are needed   if you have diabetes. Women should inform their health care provider if they wish to become pregnant or think they might be pregnant. Losing weight while pregnant is not advised and may cause harm to the unborn child. Talk to your health care provider for more  information. What side effects may I notice from receiving this medicine? Side effects that you should report to your doctor or health care professional as soon as possible:  allergic reactions like skin rash, itching or hives, swelling of the face, lips, or tongue  breathing problems  changes in emotions or moods  changes in vision  chest pain or chest tightness  fast, irregular heartbeat  feeling faint or lightheaded  increased blood pressure  irritable  restlessness  tremors  seizures  signs and symptoms of a stroke like changes in vision; confusion; trouble speaking or understanding; severe headaches; sudden numbness or weakness of the face, arm or leg; trouble walking; dizziness; loss of balance or coordination  unusually weak or tired Side effects that usually do not require medical attention (report to your doctor or health care professional if they continue or are bothersome):  changes in taste  constipation or diarrhea  dizziness  dry mouth  headache  trouble sleeping  upset stomach This list may not describe all possible side effects. Call your doctor for medical advice about side effects. You may report side effects to FDA at 1-800-FDA-1088. Where should I keep my medicine? Keep out of the reach of children. This medicine can be abused. Keep your medicine in a safe place to protect it from theft. Do not share this medicine with anyone. Selling or giving away this medicine is dangerous and against the law. This medicine may cause harm and death if it is taken by other adults, children, or pets. Return medicine that has not been used to an official disposal site. Contact the DEA at 1-800-882-9539 or your city/county government to find a site. If you cannot return the medicine, mix any unused medicine with a substance like cat litter or coffee grounds. Then throw the medicine away in a sealed container like a sealed bag or coffee can with a lid. Do not use the  medicine after the expiration date. Store at room temperature between 20 and 25 degrees C (68 and 77 degrees F). Keep container tightly closed. NOTE: This sheet is a summary. It may not cover all possible information. If you have questions about this medicine, talk to your doctor, pharmacist, or health care provider.  2020 Elsevier/Gold Standard (2018-11-06 12:54:20)  

## 2019-09-10 ENCOUNTER — Telehealth: Payer: Self-pay | Admitting: Adult Health

## 2019-09-10 MED ORDER — PHENTERMINE HCL 15 MG PO CAPS
15.0000 mg | ORAL_CAPSULE | ORAL | 0 refills | Status: DC
Start: 2019-09-10 — End: 2020-06-08

## 2019-09-10 NOTE — Telephone Encounter (Signed)
Pt wants to lose weight saw Philipp Deputy 8/25 and wants to try phentermine, will rx phentermine 15 mg will need to check weight and BP in 4 weeks

## 2019-09-27 ENCOUNTER — Other Ambulatory Visit: Payer: Self-pay | Admitting: Family Medicine

## 2019-10-21 ENCOUNTER — Other Ambulatory Visit: Payer: Self-pay | Admitting: Family Medicine

## 2019-12-15 ENCOUNTER — Other Ambulatory Visit: Payer: Medicaid Other | Admitting: Adult Health

## 2019-12-20 ENCOUNTER — Other Ambulatory Visit: Payer: Self-pay | Admitting: Family Medicine

## 2019-12-20 MED ORDER — TRIAMCINOLONE ACETONIDE 0.1 % EX CREA: TOPICAL_CREAM | CUTANEOUS | 3 refills | Status: DC

## 2020-01-07 IMAGING — DX ABDOMEN - 1 VIEW
1 series · 1 of 1 positions shown · non-contrast
Comparison: None.

CLINICAL DATA: Missing IUD

EXAM:
ABDOMEN - 1 VIEW

[abdomen kub]
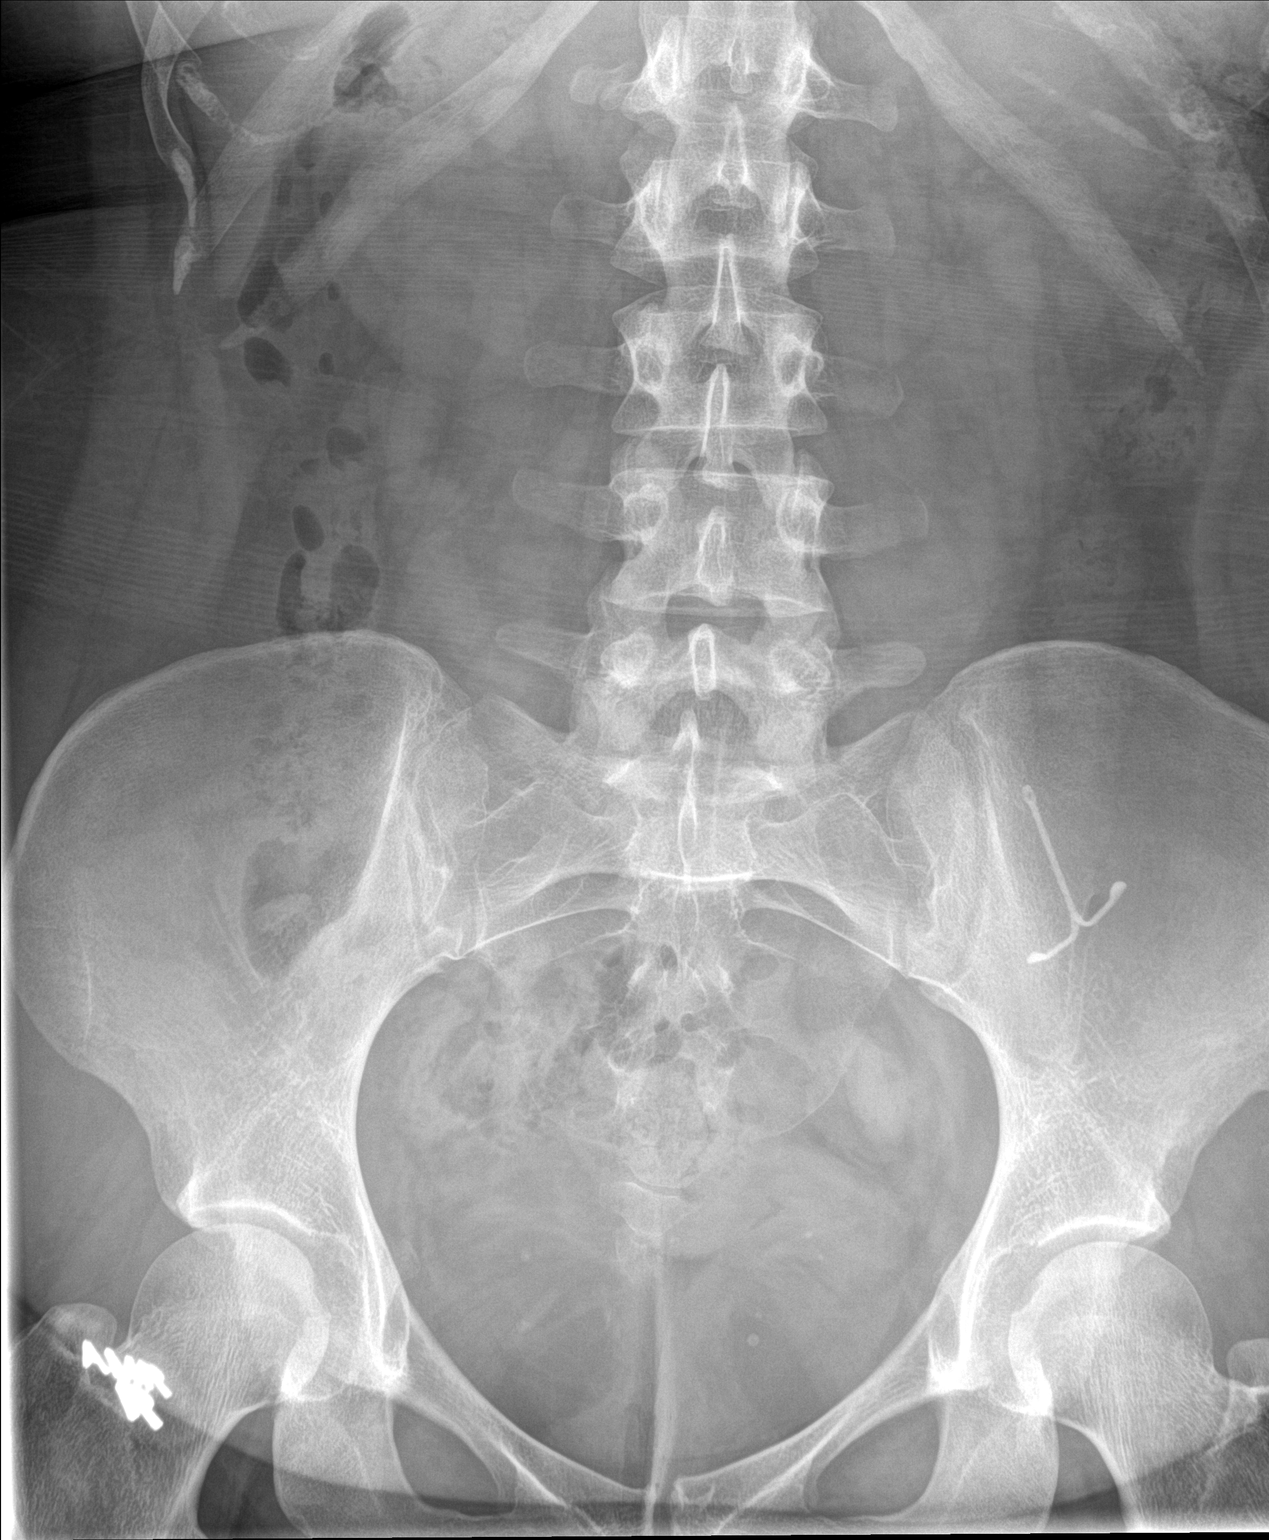

[1 of 1 positions shown; findings below may reference images not displayed]

FINDINGS: IUD is seen in the left lower quadrant the abdomen overlying the
left iliac wing. Several pelvic phleboliths also noted. The bowel
gas pattern is normal.
IMPRESSION: Abnormal IUD location in left lower quadrant, overlying the left
iliac wing.

## 2020-01-13 ENCOUNTER — Other Ambulatory Visit: Payer: Medicaid Other | Admitting: Adult Health

## 2020-02-21 ENCOUNTER — Encounter: Payer: Self-pay | Admitting: Family Medicine

## 2020-03-01 ENCOUNTER — Other Ambulatory Visit: Payer: Medicaid Other | Admitting: Adult Health

## 2020-03-02 NOTE — Telephone Encounter (Signed)
This form was completed please forward to the mother

## 2020-03-14 ENCOUNTER — Encounter: Payer: Self-pay | Admitting: Family Medicine

## 2020-05-04 ENCOUNTER — Other Ambulatory Visit: Payer: Medicaid Other | Admitting: Adult Health

## 2020-05-09 ENCOUNTER — Encounter: Payer: Self-pay | Admitting: Family Medicine

## 2020-05-09 NOTE — Telephone Encounter (Signed)
Nurses Without picture it is very difficult to diagnose via message I would recommend an office visit with myself or Clydie Braun this week thank you

## 2020-05-11 ENCOUNTER — Other Ambulatory Visit: Payer: Self-pay

## 2020-05-11 ENCOUNTER — Encounter: Payer: Self-pay | Admitting: Family Medicine

## 2020-05-11 ENCOUNTER — Ambulatory Visit (INDEPENDENT_AMBULATORY_CARE_PROVIDER_SITE_OTHER): Payer: Medicaid Other | Admitting: Family Medicine

## 2020-05-11 VITALS — BP 118/76 | HR 79 | Temp 97.9°F | Wt 177.0 lb

## 2020-05-11 DIAGNOSIS — R21 Rash and other nonspecific skin eruption: Secondary | ICD-10-CM | POA: Diagnosis not present

## 2020-05-11 DIAGNOSIS — L719 Rosacea, unspecified: Secondary | ICD-10-CM | POA: Diagnosis not present

## 2020-05-11 MED ORDER — METRONIDAZOLE 0.75 % EX LOTN: TOPICAL_LOTION | CUTANEOUS | 0 refills | Status: DC

## 2020-05-11 NOTE — Progress Notes (Signed)
Patient ID: Jennifer Aguilar, female    DOB: 1995/06/28, 25 y.o.   MRN: [id number]   Chief Complaint  Patient presents with  . redness on face for 3 days   Subjective:  CC: facial rash  This is a new problem.  Presents today with a complaint of red rash on face.  Symptoms have been present for 3 days.  Reports that it feels like a sunburn.  Also reports that she experienced this in her third trimester of her pregnancy, it resolved spontaneously.  Has not tried anything for her symptoms.  Denies fever, chills, chest pain, shortness of breath.  Endorses that her eyes feel dry and skin feels like it is burning.  Denies exposure to excessive sunlight, no new skin products.    Medical History Jennifer Aguilar has a past medical history of Hypertension, Menometrorrhagia (08/29/2014), and Seasonal allergies.   Outpatient Encounter Medications as of 05/11/2020  Medication Sig  . METRONIDAZOLE, TOPICAL, 0.75 % LOTN Apply thin layer to affected areas on face twice daily. Avoid eyes.  Marland Kitchen amLODipine (NORVASC) 5 MG tablet Take 1 tablet (5 mg total) by mouth daily. (Patient not taking: Reported on 09/08/2019)  . HYDROcodone-acetaminophen (NORCO/VICODIN) 5-325 MG tablet Take 1 tablet by mouth every 6 (six) hours as needed. (Patient not taking: Reported on 09/08/2019)  . ibuprofen (ADVIL) 600 MG tablet Take 1 tablet (600 mg total) by mouth every 6 (six) hours. (Patient not taking: Reported on 09/08/2019)  . ondansetron (ZOFRAN ODT) 8 MG disintegrating tablet Take 1 tablet (8 mg total) by mouth every 8 (eight) hours as needed for nausea or vomiting. (Patient not taking: Reported on 09/08/2019)  . phentermine 15 MG capsule Take 1 capsule (15 mg total) by mouth every morning. (Patient not taking: Reported on 05/11/2020)  . triamcinolone (KENALOG) 0.1 % Apply bid prn eczema   No facility-administered encounter medications on file as of 05/11/2020.     Review of Systems  Constitutional: Negative for appetite change, chills,  fatigue, fever and unexpected weight change.  HENT: Negative for ear pain.   Eyes: Negative for photophobia.       Eyes feel dry  Respiratory: Negative for cough and shortness of breath.   Cardiovascular: Negative for chest pain, palpitations and leg swelling.  Neurological: Negative for dizziness, light-headedness and headaches.     Vitals BP 118/76   Pulse 79   Temp 97.9 F (36.6 C) (Oral)   Wt 177 lb (80.3 kg)   SpO2 99%   BMI 29.45 kg/m   Objective:   Physical Exam Vitals reviewed.  Constitutional:      Appearance: Normal appearance.  Cardiovascular:     Rate and Rhythm: Normal rate and regular rhythm.     Heart sounds: Normal heart sounds.  Pulmonary:     Effort: Pulmonary effort is normal.     Breath sounds: Normal breath sounds.  Skin:    General: Skin is warm and dry.     Findings: Erythema and rash present. Rash is macular.     Comments: Cheeks and bridge of nose.   Neurological:     General: No focal deficit present.     Mental Status: She is alert.  Psychiatric:        Behavior: Behavior normal.      Assessment and Plan   1. Rosacea - METRONIDAZOLE, TOPICAL, 0.75 % LOTN; Apply thin layer to affected areas on face twice daily. Avoid eyes.  Dispense: 59 mL; Refill: 0  2. Facial rash -  Antinuclear Antib (ANA) - C-reactive protein - Sed Rate (ESR)   Erythematous macular rash noted across cheeks and bridge of nose.  Consulted with Dr. Sallee Lange while patient in the office, he assessed.  We will get labs today to rule out autoimmune.  We will treat for presumed rosacea with metronidazole topical lotion twice per day.  If symptoms are not greatly improved after treatment, will notify office after 2 weeks for dermatology referral to be placed.  Will notify once results of lab work become available.  Agrees with plan of care discussed today. Understands warning signs to seek further care: chest pain, shortness of breath, any significant change in  health.  Understands to follow-up in 2 weeks, call office if not significantly improved, will make dermatology referral at that time.  Jennifer Ades, NP 05/11/20

## 2020-05-11 NOTE — Patient Instructions (Addendum)
If not better in 2 weeks, call the office and we will place referral to dermatologist.    Rosacea Rosacea is a long-term (chronic) condition that affects the skin of the face, including the cheeks, nose, forehead, and chin. This condition can also affect the eyes. Rosacea causes blood vessels near the surface of the skin to get bigger (be enlarged), and that makes the skin red. What are the causes? The cause of this condition is not known. Certain things can make rosacea worse, including:  Hot baths.  Exercise.  Sunlight.  Very hot or cold temperatures.  Hot or spicy foods and drinks.  Drinking alcohol.  Stress.  Taking blood pressure medicine.  Long-term use of topical steroids on the face. What increases the risk? You are more likely to get this condition if you:  Are older than 25 years of age.  Are a woman.  Have light-colored skin (light complexion).  Have a family history of the condition. What are the signs or symptoms?  Redness of the face.  Red bumps or pimples on the face.  A red, enlarged nose.  Blushing easily.  Red lines on the skin.  Irritated, burning, or itchy feeling in the eyes.  Swollen eyelids.  Drainage from the eyes.  Feeling like there is something in your eye.   How is this treated? There is no cure for this condition, but treatment can help to control your symptoms. Your doctor may suggest that you see a skin specialist (dermatologist). Treatment may include:  Medicines that are put on the skin or taken by mouth (orally).  Laser treatment to improve how the skin looks.  Surgery. This is rare. Your doctor will also suggest the best way to take care of your skin. Even after your skin gets better, you will likely need to continue treatment to keep your rosacea from coming back. Follow these instructions at home: Skin care Take care of your skin as told by your doctor. Your doctor may tell you to do these things:  Wash your skin  gently two or more times each day.  Use mild soap.  Use a sunscreen or sunblock with SPF 30 or greater.  Use gentle cosmetics that are meant for sensitive skin.  Shave with an electric shaver instead of a blade. Lifestyle  Try to keep track of what foods make this condition worse. Avoid those foods. These may include: ? Spicy foods. ? Seafood. ? Cheese. ? Hot liquids. ? Nuts. ? Chocolate. ? Iodized salt.  Do not drink alcohol.  Avoid very cold or hot temperatures.  Try to reduce your stress. If you need help to do this, talk with your doctor.  When you exercise, do these things to stay cool: ? Limit sun exposure to your face. ? Use a fan. ? Exercise for a shorter time, and exercise more often. General instructions  Take and apply over-the-counter and prescription medicines only as told by your doctor.  If you were prescribed an antibiotic medicine, apply it or take it as told by your doctor. Do not stop using the antibiotic even if your condition improves.  If your eyelids are affected, hold warm compresses on them. Do this as told by your doctor.  Keep all follow-up visits as told by your doctor. This is important. Contact a doctor if:  Your symptoms get worse.  Your symptoms do not improve after 2 months of treatment.  You have new symptoms.  You have any changes in how you see (  vision) or you have problems with your eyes, such as redness or itching.  You feel very sad (depressed).  You do not want to eat as much as normal (lose your appetite).  You have trouble focusing your mind (concentrating). Summary  Rosacea is a long-term condition that affects the skin of the face, including the cheeks, nose, forehead, and chin.  Take care of your skin as told by your doctor.  Take and apply medicines only as told by your doctor.  Contact a doctor if your symptoms get worse or if you have problems with your eyes. This information is not intended to replace  advice given to you by your health care provider. Make sure you discuss any questions you have with your health care provider. Document Revised: 06/04/2017 Document Reviewed: 06/04/2017 Elsevier Patient Education  2021 [employer].

## 2020-05-12 LAB — ANA: Anti Nuclear Antibody (ANA): NEGATIVE

## 2020-05-12 LAB — SEDIMENTATION RATE: Sed Rate: 3 mm/hr (ref 0–32)

## 2020-05-12 LAB — C-REACTIVE PROTEIN: CRP: 2 mg/L (ref 0–10)

## 2020-05-21 ENCOUNTER — Encounter: Payer: Self-pay | Admitting: Family Medicine

## 2020-05-22 ENCOUNTER — Other Ambulatory Visit: Payer: Self-pay | Admitting: Family Medicine

## 2020-05-22 DIAGNOSIS — L719 Rosacea, unspecified: Secondary | ICD-10-CM

## 2020-05-22 MED ORDER — METRONIDAZOLE 0.75 % EX LOTN: TOPICAL_LOTION | CUTANEOUS | 0 refills | Status: DC

## 2020-05-22 NOTE — Progress Notes (Signed)
Rash not resolved. Needs derm referral

## 2020-05-24 ENCOUNTER — Other Ambulatory Visit: Payer: Self-pay | Admitting: Family Medicine

## 2020-05-24 DIAGNOSIS — L719 Rosacea, unspecified: Secondary | ICD-10-CM

## 2020-05-25 NOTE — Telephone Encounter (Signed)
Need to call [facility] and inquire about Metronidazole

## 2020-05-26 NOTE — Telephone Encounter (Signed)
Pt already has refills. Insurance is not covering med now and it is $104. Pt wants to know if it can be changed to something else.

## 2020-05-26 NOTE — Telephone Encounter (Signed)
Pt posted pic of metronidazole lotion

## 2020-05-26 NOTE — Telephone Encounter (Signed)
May have 5 refills of this metronidazole lotion

## 2020-05-28 ENCOUNTER — Other Ambulatory Visit: Payer: Self-pay | Admitting: Family Medicine

## 2020-06-01 DIAGNOSIS — L709 Acne, unspecified: Secondary | ICD-10-CM | POA: Diagnosis not present

## 2020-06-08 ENCOUNTER — Other Ambulatory Visit: Payer: Self-pay

## 2020-06-08 ENCOUNTER — Other Ambulatory Visit (HOSPITAL_COMMUNITY)
Admission: RE | Admit: 2020-06-08 | Discharge: 2020-06-08 | Disposition: A | Discharge status: Home / Self Care | Payer: Medicaid Other | Source: Ambulatory Visit | Attending: Obstetrics & Gynecology | Admitting: Obstetrics & Gynecology

## 2020-06-08 ENCOUNTER — Ambulatory Visit (INDEPENDENT_AMBULATORY_CARE_PROVIDER_SITE_OTHER): Payer: Medicaid Other | Admitting: Women's Health

## 2020-06-08 ENCOUNTER — Encounter: Payer: Self-pay | Admitting: Women's Health

## 2020-06-08 VITALS — BP 132/84 | HR 93 | Ht 65.0 in | Wt 174.0 lb

## 2020-06-08 DIAGNOSIS — Z124 Encounter for screening for malignant neoplasm of cervix: Secondary | ICD-10-CM

## 2020-06-08 DIAGNOSIS — N920 Excessive and frequent menstruation with regular cycle: Secondary | ICD-10-CM | POA: Diagnosis not present

## 2020-06-08 LAB — POCT HEMOGLOBIN: Hemoglobin: 14.5 g/dL (ref 11–14.6)

## 2020-06-08 NOTE — Addendum Note (Signed)
Addended by: Moss Mc on: 06/08/2020 12:02 PM   Modules accepted: Orders

## 2020-06-08 NOTE — Progress Notes (Signed)
   GYN VISIT Patient name: Jennifer Aguilar MRN [id number]  Date of birth: [birth year] Chief Complaint:   Menorrhagia (With cycle)  History of Present Illness:   Jennifer Aguilar is a 25 y.o. [phone] Caucasian female being seen today for report of heavy periods. One/mth, lasts 7d, heavy 1st 3-4d- changes saturated pad 3-4x/hr, no clots, mild cramping. Denies abnormal discharge, itching/odor/irritation.  Feels it has been this way since her 4th child was born 2021. Had bilateral salpingectomy July 2021.  Denies dizziness/lightheadedness.  Depression screen [facility] 2/9 07/08/2019 01/04/2019 05/09/2016 04/24/2016  Decreased Interest 0 0 0 0  Down, Depressed, Hopeless 0 0 0 0  PHQ - 2 Score 0 0 0 0  Altered sleeping 0 0 0 -  Tired, decreased energy 0 0 1 -  Change in appetite 0 0 0 -  Feeling bad or failure about yourself  0 0 0 -  Trouble concentrating 0 0 0 -  Moving slowly or fidgety/restless 0 0 0 -  Suicidal thoughts - 0 0 -  PHQ-9 Score 0 0 1 -    Patient's last menstrual period was 05/31/2020. The current method of family planning is bilateral salpingecotmy 07/21/19.  Last pap 01/04/19. Results were: ASCUS w/ HRHPV positive: other (not 16, 18/45) Review of Systems:   Pertinent items are noted in HPI Denies fever/chills, dizziness, headaches, visual disturbances, fatigue, shortness of breath, chest pain, abdominal pain, vomiting, abnormal vaginal discharge/itching/odor/irritation, problems with periods, bowel movements, urination, or intercourse unless otherwise stated above.  Pertinent History Reviewed:  Reviewed past medical,surgical, social, obstetrical and family history.  Reviewed problem list, medications and allergies. Physical Assessment:   Vitals:   06/08/20 1050  BP: 132/84  Pulse: 93  Weight: 174 lb (78.9 kg)  Height: 5\' 5"  (1.651 m)  Body mass index is 28.96 kg/m.       Physical Examination:   General appearance: alert, well appearing, and in no distress  Mental status: alert,  oriented to person, place, and time  Skin: warm & dry   Cardiovascular: normal heart rate noted  Respiratory: normal respiratory effort, no distress  Abdomen: soft, non-tender   Pelvic: VULVA: normal appearing vulva with no masses, tenderness or lesions, VAGINA: normal appearing vagina with normal color and discharge, no lesions, CERVIX: normal appearing cervix without discharge or lesions, thin prep pap obtained. UTERUS: uterus is normal size, shape, consistency and nontender, ADNEXA: normal adnexa in size, nontender and no masses  Extremities: no edema   Chaperone: Latisha Cresenzo   Fingerstick hgb 14.5  No results found for this or any previous visit (from the past 24 hour(s)).  Assessment & Plan:  1) Menorrhagia w/ regular cycles> check gc/ct, vaginitis from pap, if neg discussed options- wants to try COCs  2) H/O abnormal pap> w/o f/u, pap obtained today, will contact her w/ results  Meds: No orders of the defined types were placed in this encounter.   Orders Placed This Encounter  Procedures  . POCT hemoglobin    No follow-ups on file.  CNM, [facility] 06/08/2020 11:30 AM

## 2020-06-15 LAB — CYTOLOGY - PAP
Chlamydia: NEGATIVE
Comment: NEGATIVE
Comment: NEGATIVE
Comment: NEGATIVE
Comment: NORMAL
HPV 16: NEGATIVE
HPV 18 / 45: NEGATIVE
High risk HPV: POSITIVE — AB
Neisseria Gonorrhea: NEGATIVE

## 2020-07-06 DIAGNOSIS — L719 Rosacea, unspecified: Secondary | ICD-10-CM | POA: Diagnosis not present

## 2020-07-20 ENCOUNTER — Other Ambulatory Visit: Payer: Self-pay

## 2020-07-20 ENCOUNTER — Other Ambulatory Visit (HOSPITAL_COMMUNITY)
Admission: RE | Admit: 2020-07-20 | Discharge: 2020-07-20 | Disposition: A | Discharge status: Home / Self Care | Payer: Medicaid Other | Source: Ambulatory Visit | Attending: Obstetrics & Gynecology | Admitting: Obstetrics & Gynecology

## 2020-07-20 ENCOUNTER — Encounter: Payer: Self-pay | Admitting: Women's Health

## 2020-07-20 ENCOUNTER — Ambulatory Visit (INDEPENDENT_AMBULATORY_CARE_PROVIDER_SITE_OTHER): Payer: Medicaid Other | Admitting: Women's Health

## 2020-07-20 VITALS — BP 137/85 | HR 130 | Ht 65.0 in | Wt 168.0 lb

## 2020-07-20 DIAGNOSIS — R87612 Low grade squamous intraepithelial lesion on cytologic smear of cervix (LGSIL): Secondary | ICD-10-CM | POA: Insufficient documentation

## 2020-07-20 NOTE — Patient Instructions (Signed)
[email or web address]  Colposcopy, Care After This sheet gives you information about how to care for yourself after your procedure. Your health care provider may also give you more specific instructions. If you have problems or questions, contact your health careprovider. What can I expect after the procedure? If you had a colposcopy without a biopsy, you can expect to feel fine right away after your procedure. However, you may have some spotting of blood for afew days. You can return to your normal activities. If you had a colposcopy with a biopsy, it is common after the procedure to have: Soreness and mild pain. These may last for a few days. Light-headedness. Mild vaginal bleeding or discharge that is dark-colored and grainy. This may last for a few days. The discharge may be caused by a liquid (solution) that was used during the procedure. You may need to wear a sanitary pad during this time. Spotting of blood for at least 48 hours after the procedure. Follow these instructions at home: Medicines Take over-the-counter and prescription medicines only as told by your health care provider. Talk with your health care provider about what type of over-the-counter pain medicine and prescription medicine you can start to take again. It is especially important to talk with your health care provider if you take blood thinners. Activity Limit your physical activity for the first day after your procedure as told by your health care provider. Avoid using douche products, using tampons, or having sex for at least 3 days after the procedure or for as long as told. Return to your normal activities as told by your health care provider. Ask your health care provider what activities are safe for you. General instructions  Drink enough fluid to keep your urine pale yellow. Ask your health care provider if you may take baths, swim, or use a hot tub. You may take showers. If you use  birth control (contraception), continue to use it. Keep all follow-up visits as told by your health care provider. This is important.  Contact a health care provider if: You develop a skin rash. Get help right away if: You bleed a lot from your vagina or pass blood clots. This includes using more than one sanitary pad each hour for 2 hours in a row. You have a fever or chills. You have vaginal discharge that is abnormal, is yellow in color, or smells bad. This could be a sign of infection. You have severe pain or cramps in your lower abdomen that do not go away with medicine. You faint. Summary If you had a colposcopy without a biopsy, you can expect to feel fine right away, but you may have some spotting of blood for a few days. You can return to your normal activities. If you had a colposcopy with a biopsy, it is common to have mild pain for a few days and spotting for 48 hours after the procedure. Avoid using douche products, using tampons, and having sex for at least 3 days after the procedure or for as long as told by your health care provider. Get help right away if you have heavy bleeding, severe pain, or signs of infection. This information is not intended to replace advice given to you by your health care provider. Make sure you discuss any questions you have with your healthcare provider. Document Revised: 12/30/2018 Document Reviewed: 12/30/2018 Elsevier Patient Education  2022 [employer]vier Inc.  

## 2020-07-20 NOTE — Progress Notes (Signed)
   COLPOSCOPY PROCEDURE NOTE Patient name: Jennifer Aguilar MRN [id number]  Date of birth: Aug 02, 1995 Subjective Findings:   Jennifer Aguilar is a 25 y.o. [phone] Caucasian female being seen today for a colposcopy. Indication: Abnormal pap on 06/08/20: LSIL-H w/ HRHPV positive: other (not 16, 18/45) w/ possible glandular involvement Prior cytology:  Date Result Procedure  01/04/19 ASCUS w/ HRHPV positive: type not specified Didn't f/u for 24yr repeat pap until 2022  05/09/16 ASCUS w/ HRHPV negative Didn't f/u for 53yr repeat pap  until 2020          Patient's last menstrual period was 07/20/2020. Contraception: tubal ligation. Menopausal: no. Hysterectomy: no.   Smoker: no. Immunocompromised: no.   The risks and benefits were explained and informed consent was obtained, and written copy is in chart. Pertinent History Reviewed:   Reviewed past medical,surgical, social, obstetrical and family history.  Reviewed problem list, medications and allergies. Objective Findings & Procedure:   Vitals:   07/20/20 1338  BP: 137/85  Pulse: (!) 130  Weight: 168 lb (76.2 kg)  Height: 5\' 5"  (1.651 m)  Body mass index is 27.96 kg/m.  No results found for this or any previous visit (from the past 24 hour(s)).   Time out was performed.  Speculum placed in the vagina, cervix fully visualized. SCJ: fully visualized. Cervix swabbed x 3 with acetic acid.  Acetowhitening present: Yes Cervix: dense acetowhite lesion(s) noted at 1, 7 and 9 o'clock and mosaicism noted at 1 and 7 o'clock-possible abnormal vessels at 7. Endocervical curettage performed, Cervical biopsies taken at 1,7, 9 o'clock, and Hemostasis achieved with Monsel's solution. Vagina: vaginal colposcopy not performed Vulva: vulvar colposcopy not performed  Specimens: 4  Complications: none  Chaperone:    UPT: neg  Colposcopic Impression & Plan:   Colposcopy findings consistent with HSIL Plan: Post biopsy instructions given,  Will notify patient of results when back, and Will base plan of care on pathology results and ASCCP guidelines  Return in about 1 year (around 07/20/2021) for Pap & physical.  09/20/2021 CNM, WHNP-BC 07/20/2020 2:13 PM

## 2020-07-20 NOTE — Addendum Note (Signed)
Addended by: Moss Mc on: 07/20/2020 04:37 PM   Modules accepted: Orders

## 2020-07-24 ENCOUNTER — Telehealth: Payer: Self-pay | Admitting: Women's Health

## 2020-07-24 LAB — SURGICAL PATHOLOGY

## 2020-07-24 NOTE — Telephone Encounter (Signed)
Pt called back, appt scheduled  07/26/2020 @ 9:10am w/Dr. Charlotta Newton

## 2020-07-24 NOTE — Telephone Encounter (Signed)
[facility] - need to schedule appt with Dr. Despina Hidden or Dr. Charlotta Newton to discuss abnormal colpo results per Louie Bun

## 2020-07-26 ENCOUNTER — Other Ambulatory Visit: Payer: Self-pay

## 2020-07-26 ENCOUNTER — Encounter: Payer: Self-pay | Admitting: Obstetrics & Gynecology

## 2020-07-26 ENCOUNTER — Ambulatory Visit: Payer: Medicaid Other | Admitting: Obstetrics & Gynecology

## 2020-07-26 VITALS — BP 131/92 | HR 86 | Ht 65.0 in | Wt 169.6 lb

## 2020-07-26 DIAGNOSIS — D069 Carcinoma in situ of cervix, unspecified: Secondary | ICD-10-CM | POA: Diagnosis not present

## 2020-07-26 DIAGNOSIS — N871 Moderate cervical dysplasia: Secondary | ICD-10-CM | POA: Diagnosis not present

## 2020-07-26 DIAGNOSIS — R87810 Cervical high risk human papillomavirus (HPV) DNA test positive: Secondary | ICD-10-CM | POA: Diagnosis not present

## 2020-07-26 NOTE — Progress Notes (Signed)
   GYN VISIT Patient name: Jennifer Aguilar MRN [id number]  Date of birth: 01-13-1996 Chief Complaint:   discuss abnormal colpo results  History of Present Illness:   Jennifer Aguilar is a 25 y.o. [phone] female being seen today for cervical dysplasia.  Records were reviewed: Pap: LSIL, HPV+ Colposcopy results:  9 o'clock- CIN 1     1 o'clock- CIN 2    7 o'clock- CIN 2/3  She denies irregular bleeding/postcoital bleeding.  Denies irregular discharge.  No acute complaints.  Does not desire future pregnancy, tubal ligation completed for contraception  Patient's last menstrual period was 07/20/2020.  Depression screen [facility] 2/9 07/08/2019 01/04/2019 05/09/2016 04/24/2016  Decreased Interest 0 0 0 0  Down, Depressed, Hopeless 0 0 0 0  PHQ - 2 Score 0 0 0 0  Altered sleeping 0 0 0 -  Tired, decreased energy 0 0 1 -  Change in appetite 0 0 0 -  Feeling bad or failure about yourself  0 0 0 -  Trouble concentrating 0 0 0 -  Moving slowly or fidgety/restless 0 0 0 -  Suicidal thoughts - 0 0 -  PHQ-9 Score 0 0 1 -     Review of Systems:   Pertinent items are noted in HPI Denies fever/chills, dizziness, headaches, visual disturbances, fatigue, shortness of breath, chest pain, abdominal pain, vomiting, no problems with periods, bowel movements, urination, or intercourse unless otherwise stated above.  Pertinent History Reviewed:  Reviewed past medical,surgical, social, obstetrical and family history.  Reviewed problem list, medications and allergies. Physical Assessment:   Vitals:   07/26/20 0915  BP: (!) 131/92  Pulse: 86  Weight: 169 lb 9.6 oz (76.9 kg)  Height: 5\' 5"  (1.651 m)  Body mass index is 28.22 kg/m.       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 -Risk/benefit reviewed and she desires to proceed with treatment -plan to coordinate for surgical scheduling  Majority of today's visit was spent in consultation regarding the above information, .  No orders of the defined types were placed in this encounter.   Return for to be determined- for LEEP procedure.   , DO Attending Obstetrician & Gynecologist, [facility] for [facility]

## 2020-08-17 DIAGNOSIS — L719 Rosacea, unspecified: Secondary | ICD-10-CM | POA: Diagnosis not present

## 2020-08-30 ENCOUNTER — Telehealth: Payer: Self-pay | Admitting: Family Medicine

## 2020-08-30 NOTE — Telephone Encounter (Signed)
Patient wanting you to check out medication she looked up called clonidine its suppose to be used for face flushing which  she has a lot of lately.Will put copy in your folder.

## 2020-09-06 NOTE — Patient Instructions (Signed)
Jennifer Aguilar  09/06/2020     @PREFPERIOPPHARMACY @   Your procedure is scheduled on  09/13/2020.   Report to [facility] at  1030 A.M.   Call this number if you have problems the morning of surgery:  [phone]   Remember:  Do not eat or drink after midnight.      Take these medicines the morning of surgery with A SIP OF WATER                                               None     Do not wear jewelry, make-up or nail polish.  Do not wear lotions, powders, or perfumes, or deodorant.  Do not shave 48 hours prior to surgery.  Men may shave face and neck.  Do not bring valuables to the hospital.  [facility] is not responsible for any belongings or valuables.  Contacts, dentures or bridgework may not be worn into surgery.  Leave your suitcase in the car.  After surgery it may be brought to your room.  For patients admitted to the hospital, discharge time will be determined by your treatment team.  Patients discharged the day of surgery will not be allowed to drive home and must have someone with them for 24 hours.    Special instructions:   DO NOT smoke tobacco or vape for 24 hours before your procedure.  Please read over the following fact sheets that you were given. Coughing and Deep Breathing, Surgical Site Infection Prevention, Anesthesia Post-op Instructions, and Care and Recovery After Surgery      Cervical Laser Surgery, Care After This sheet gives you information about how to care for yourself after your procedure. Your health care provider may also give you more specific instructions. If you have problems or questions, contact your health careprovider. What can I expect after the procedure? After the procedure, it is common to have: Pain or discomfort. Mild cramping. Bleeding, spotting, or brownish discharge from your vagina. Follow these instructions at home: Activity  Rest as told by your health care provider. Do not lift anything that is  heavier than 10 lb (4.5 kg), or the limit that you are told, until your health care provider says that it is safe. Do not have sex until your health care provider says it is okay. Return to your normal activities as told by your health care provider. Ask your health care provider what activities are safe for you.  General instructions Take over-the-counter and prescription medicines only as told by your health care provider. Ask your health care provider if the medicine prescribed to you requires you to avoid driving or using heavy machinery. Wear sanitary pads to absorb any bleeding, spotting, and discharge. Do not douche or put anything into your vagina, including tampons, until your health care provider says it is okay. It is up to you to get the results of your procedure. Ask your health care provider, or the department that is doing the procedure, when your results will be ready. Keep all follow-up visits as told by your health care provider. This is important. Contact a health care provider if: Your pain or cramping does not improve. Your periods are more painful than usual. You do not get your period as expected. Get help right away if you have: Any  symptoms of infection, such as: A fever. Chills. Discharge that smells bad. Severe pain in your abdomen. Heavy bleeding from your vagina (more than a normal period). Vaginal bleeding with clumps of blood (blood clots). Summary After this procedure, it is common to have pain or discomfort and mild cramping. It is also common to have bleeding, spotting, or brownish discharge from your vagina. Do not have sex, douche, use tampons, or put anything in your vagina until your health care provider says it is okay. Return to your normal activities as told by your health care provider. Ask your health care provider what activities are safe for you. Take over-the-counter and prescription medicines only as told by your health care provider. You may  need to wear sanitary pads to absorb any bleeding, spotting, and discharge. This information is not intended to replace advice given to you by your health care provider. Make sure you discuss any questions you have with your healthcare provider. Document Revised: 06/15/2018 Document Reviewed: 06/15/2018 Elsevier Patient Education  2022 Elsevier Inc. General Anesthesia, Adult, Care After This sheet gives you information about how to care for yourself after your procedure. Your health care provider may also give you more specific instructions. If you have problems or questions, contact your health careprovider. What can I expect after the procedure? After the procedure, the following side effects are common: Pain or discomfort at the IV site. Nausea. Vomiting. Sore throat. Trouble concentrating. Feeling cold or chills. Feeling weak or tired. Sleepiness and fatigue. Soreness and body aches. These side effects can affect parts of the body that were not involved in surgery. Follow these instructions at home: For the time period you were told by your health care provider:  Rest. Do not participate in activities where you could fall or become injured. Do not drive or use machinery. Do not drink alcohol. Do not take sleeping pills or medicines that cause drowsiness. Do not make important decisions or sign legal documents. Do not take care of children on your own.  Eating and drinking Follow any instructions from your health care provider about eating or drinking restrictions. When you feel hungry, start by eating small amounts of foods that are soft and easy to digest (bland), such as toast. Gradually return to your regular diet. Drink enough fluid to keep your urine pale yellow. If you vomit, rehydrate by drinking water, juice, or clear broth. General instructions If you have sleep apnea, surgery and certain medicines can increase your risk for breathing problems. Follow instructions from  your health care provider about wearing your sleep device: Anytime you are sleeping, including during daytime naps. While taking prescription pain medicines, sleeping medicines, or medicines that make you drowsy. Have a responsible adult stay with you for the time you are told. It is important to have someone help care for you until you are awake and alert. Return to your normal activities as told by your health care provider. Ask your health care provider what activities are safe for you. Take over-the-counter and prescription medicines only as told by your health care provider. If you smoke, do not smoke without supervision. Keep all follow-up visits as told by your health care provider. This is important. Contact a health care provider if: You have nausea or vomiting that does not get better with medicine. You cannot eat or drink without vomiting. You have pain that does not get better with medicine. You are unable to pass urine. You develop a skin rash. You have a fever.  You have redness around your IV site that gets worse. Get help right away if: You have difficulty breathing. You have chest pain. You have blood in your urine or stool, or you vomit blood. Summary After the procedure, it is common to have a sore throat or nausea. It is also common to feel tired. Have a responsible adult stay with you for the time you are told. It is important to have someone help care for you until you are awake and alert. When you feel hungry, start by eating small amounts of foods that are soft and easy to digest (bland), such as toast. Gradually return to your regular diet. Drink enough fluid to keep your urine pale yellow. Return to your normal activities as told by your health care provider. Ask your health care provider what activities are safe for you. This information is not intended to replace advice given to you by your health care provider. Make sure you discuss any questions you have with  your healthcare provider. Document Revised: 09/16/2019 Document Reviewed: 04/15/2019 Elsevier Patient Education  2022 Elsevier Inc. How to Use Chlorhexidine for Bathing Chlorhexidine gluconate (CHG) is a germ-killing (antiseptic) solution that is used to clean the skin. It can get rid of the bacteria that normally live on the skin and can keep them away for about 24 hours. To clean your skin with CHG, you may be given: A CHG solution to use in the shower or as part of a sponge bath. A prepackaged cloth that contains CHG. Cleaning your skin with CHG may help lower the risk for infection: While you are staying in the intensive care unit of the hospital. If you have a vascular access, such as a central line, to provide short-term or long-term access to your veins. If you have a catheter to drain urine from your bladder. If you are on a ventilator. A ventilator is a machine that helps you breathe by moving air in and out of your lungs. After surgery. What are the risks? Risks of using CHG include: A skin reaction. Hearing loss, if CHG gets in your ears. Eye injury, if CHG gets in your eyes and is not rinsed out. The CHG product catching fire. Make sure that you avoid smoking and flames after applying CHG to your skin. Do not use CHG: If you have a chlorhexidine allergy or have previously reacted to chlorhexidine. On babies younger than 222 months of age. How to use CHG solution Use CHG only as told by your health care provider, and follow the instructions on the label. Use the full amount of CHG as directed. Usually, this is one bottle. During a shower Follow these steps when using CHG solution during a shower (unless your health care provider gives you different instructions): Start the shower. Use your normal soap and shampoo to wash your face and hair. Turn off the shower or move out of the shower stream. Pour the CHG onto a clean washcloth. Do not use any type of brush or rough-edged  sponge. Starting at your neck, lather your body down to your toes. Make sure you follow these instructions: If you will be having surgery, pay special attention to the part of your body where you will be having surgery. Scrub this area for at least 1 minute. Do not use CHG on your head or face. If the solution gets into your ears or eyes, rinse them well with water. Avoid your genital area. Avoid any areas of skin that have broken  skin, cuts, or scrapes. Scrub your back and under your arms. Make sure to wash skin folds. Let the lather sit on your skin for 1-2 minutes or as long as told by your health care provider. Thoroughly rinse your entire body in the shower. Make sure that all body creases and crevices are rinsed well. Dry off with a clean towel. Do not put any substances on your body afterward--such as powder, lotion, or perfume--unless you are told to do so by your health care provider. Only use lotions that are recommended by the manufacturer. Put on clean clothes or pajamas. If it is the night before your surgery, sleep in clean sheets.  During a sponge bath Follow these steps when using CHG solution during a sponge bath (unless your health care provider gives you different instructions): Use your normal soap and shampoo to wash your face and hair. Pour the CHG onto a clean washcloth. Starting at your neck, lather your body down to your toes. Make sure you follow these instructions: If you will be having surgery, pay special attention to the part of your body where you will be having surgery. Scrub this area for at least 1 minute. Do not use CHG on your head or face. If the solution gets into your ears or eyes, rinse them well with water. Avoid your genital area. Avoid any areas of skin that have broken skin, cuts, or scrapes. Scrub your back and under your arms. Make sure to wash skin folds. Let the lather sit on your skin for 1-2 minutes or as long as told by your health care  provider. Using a different clean, wet washcloth, thoroughly rinse your entire body. Make sure that all body creases and crevices are rinsed well. Dry off with a clean towel. Do not put any substances on your body afterward--such as powder, lotion, or perfume--unless you are told to do so by your health care provider. Only use lotions that are recommended by the manufacturer. Put on clean clothes or pajamas. If it is the night before your surgery, sleep in clean sheets. How to use CHG prepackaged cloths Only use CHG cloths as told by your health care provider, and follow the instructions on the label. Use the CHG cloth on clean, dry skin. Do not use the CHG cloth on your head or face unless your health care provider tells you to. When washing with the CHG cloth: Avoid your genital area. Avoid any areas of skin that have broken skin, cuts, or scrapes. Before surgery Follow these steps when using a CHG cloth to clean before surgery (unless your health care provider gives you different instructions): Using the CHG cloth, vigorously scrub the part of your body where you will be having surgery. Scrub using a back-and-forth motion for 3 minutes. The area on your body should be completely wet with CHG when you are done scrubbing. Do not rinse. Discard the cloth and let the area air-dry. Do not put any substances on the area afterward, such as powder, lotion, or perfume. Put on clean clothes or pajamas. If it is the night before your surgery, sleep in clean sheets.  For general bathing Follow these steps when using CHG cloths for general bathing (unless your health care provider gives you different instructions). Use a separate CHG cloth for each area of your body. Make sure you wash between any folds of skin and between your fingers and toes. Wash your body in the following order, switching to a new cloth after  each step: The front of your neck, shoulders, and chest. Both of your arms, under your  arms, and your hands. Your stomach and groin area, avoiding the genitals. Your right leg and foot. Your left leg and foot. The back of your neck, your back, and your buttocks. Do not rinse. Discard the cloth and let the area air-dry. Do not put any substances on your body afterward--such as powder, lotion, or perfume--unless you are told to do so by your health care provider. Only use lotions that are recommended by the manufacturer. Put on clean clothes or pajamas. Contact a health care provider if: Your skin gets irritated after scrubbing. You have questions about using your solution or cloth. Get help right away if: Your eyes become very red or swollen. Your eyes itch badly. Your skin itches badly and is red or swollen. Your hearing changes. You have trouble seeing. You have swelling or tingling in your mouth or throat. You have trouble breathing. You swallow any chlorhexidine. Summary Chlorhexidine gluconate (CHG) is a germ-killing (antiseptic) solution that is used to clean the skin. Cleaning your skin with CHG may help to lower your risk for infection. You may be given CHG to use for bathing. It may be in a bottle or in a prepackaged cloth to use on your skin. Carefully follow your health care provider's instructions and the instructions on the product label. Do not use CHG if you have a chlorhexidine allergy. Contact your health care provider if your skin gets irritated after scrubbing. This information is not intended to replace advice given to you by your health care provider. Make sure you discuss any questions you have with your healthcare provider. Document Revised: 05/14/2019 Document Reviewed: 06/18/2019 Elsevier Patient Education  2022 [employer].

## 2020-09-10 NOTE — H&P (Signed)
[facility]ulty [facility] Obstetrics and Gynecology Attending History and Physical  Jennifer Aguilar is a 25 y.o. [phone] presents for laser ablation due to cervical dysplasia.  Recent colposcopy showed the following:  Colposcopy results:    9 o'clock- CIN 1                                     1 o'clock- CIN 2                                     7 o'clock- CIN 2/3 PT has has abnormal pap smears since 2018, initially with ASCUS, the most recent 06/08/20: LSIL-H with HPV positive.  She denies postcoital bleeding.  Denies any abnormal vaginal discharge, fevers, chills, sweats, dysuria, nausea, vomiting, other GI or GU symptoms or other general symptoms.  Past Medical History:  Diagnosis Date   Hypertension    Menometrorrhagia 08/29/2014   Seasonal allergies    Past Surgical History:  Procedure Laterality Date   IUD REMOVAL     LAPAROSCOPIC BILATERAL SALPINGECTOMY Bilateral 07/21/2019   Procedure: LAPAROSCOPIC BILATERAL SALPINGECTOMY FOR STERILIZATION;  Surgeon: Lazaro Arms, MD;  Location: [facility];  Service: Gynecology;  Laterality: Bilateral;   LAPAROSCOPY N/A 09/01/2018   Procedure: LAPAROSCOPY DIAGNOSTIC, REMOVAL OF INTRAPERITONEAL INTRAUTERINE DEVICE;  Surgeon: Tilda Burrow, MD;  Location: [facility];  Service: Gynecology;  Laterality: N/A;   NO PAST SURGERIES     OB History  Gravida Para Term Preterm AB Living  4 4 4     4   SAB IAB Ectopic Multiple Live Births        0 4    # Outcome Date GA Lbr Len/2nd Weight Sex Delivery Anes PTL Lv  4 Term 06/23/19 [redacted]w[redacted]d / 00:04 3589 g M Vag-Spont Local  LIV  3 Term 09/07/16 [redacted]w[redacted]d / 00:05 3245 g M Vag-Spont None N LIV  2 Term 06/24/14 [redacted]w[redacted]d 00:38 / 00:02 2905 g M Vag-Spont None N LIV  1 Term 05/15/13 [redacted]w[redacted]d / 00:08 2710 g F Vag-Spont None N LIV     Complications: Gestational hypertension  Patient denies any other pertinent gynecologic issues.  No current facility-administered medications on file prior to encounter.   No current outpatient medications on  file prior to encounter.   Allergies  Allergen Reactions   Meloxicam     REACTION: increased heart rate, body numbness; tolerates ibuprofen, per pt    Social History:   reports that she has never smoked. She has never used smokeless tobacco. She reports that she does not drink alcohol and does not use drugs. Family History  Problem Relation Age of Onset   Diabetes Mother    Hypertension Mother    Hypertension Father    Heart failure Maternal Grandfather    Hypertension Sister    Hypertension Paternal Grandfather    Heart failure Maternal Grandmother    Diabetes Maternal Grandmother    Cancer Paternal Aunt    Hypertension Paternal Uncle     Review of Systems: Pertinent items noted in HPI and remainder of comprehensive ROS otherwise negative.  PHYSICAL EXAM: O:vitals to be obtained on arrival  CONSTITUTIONAL: Well-developed, well-nourished female in no acute distress.  HENT:  Normocephalic, atraumatic, External right and left ear normal. Oropharynx is clear and moist EYES: Conjunctivae and EOM are normal. Pupils are equal, round, and  reactive to light. No scleral icterus.  NECK: Normal range of motion, supple, no masses SKIN: Skin is warm and dry. No rash noted. Not diaphoretic. No erythema. No pallor. NEUROLOGIC: Alert and oriented to person, place, and time. PSYCHIATRIC: Normal mood and affect. Normal behavior. Normal judgment and thought content. CARDIOVASCULAR: Normal heart rate noted, regular rhythm RESPIRATORY: Effort and breath sounds normal, no problems with respiration noted ABDOMEN: Soft, nontender, nondistended. PELVIC: deferred MUSCULOSKELETAL: Normal range of motion. No tenderness.    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  Questions and  concerns were addressed and she desires to proceed.   Myna Hidalgo, DO Attending Obstetrician & Gynecologist, [facility] for [employer], [facility]

## 2020-09-11 ENCOUNTER — Encounter (HOSPITAL_COMMUNITY)
Admission: RE | Admit: 2020-09-11 | Discharge: 2020-09-11 | Disposition: A | Discharge status: Home / Self Care | Payer: Medicaid Other | Source: Ambulatory Visit | Attending: Obstetrics & Gynecology | Admitting: Obstetrics & Gynecology

## 2020-09-11 ENCOUNTER — Other Ambulatory Visit: Payer: Self-pay

## 2020-09-11 ENCOUNTER — Other Ambulatory Visit: Payer: Self-pay | Admitting: Obstetrics & Gynecology

## 2020-09-11 ENCOUNTER — Encounter (HOSPITAL_COMMUNITY): Payer: Self-pay

## 2020-09-11 DIAGNOSIS — Z01812 Encounter for preprocedural laboratory examination: Secondary | ICD-10-CM | POA: Diagnosis not present

## 2020-09-11 HISTORY — DX: Gestational (pregnancy-induced) hypertension without significant proteinuria, unspecified trimester: O13.9

## 2020-09-11 LAB — CBC WITH DIFFERENTIAL/PLATELET
Abs Immature Granulocytes: 0.03 10*3/uL (ref 0.00–0.07)
Basophils Absolute: 0 10*3/uL (ref 0.0–0.1)
Basophils Relative: 1 %
Eosinophils Absolute: 0.2 10*3/uL (ref 0.0–0.5)
Eosinophils Relative: 2 %
HCT: 40.4 % (ref 36.0–46.0)
Hemoglobin: 13 g/dL (ref 12.0–15.0)
Immature Granulocytes: 0 %
Lymphocytes Relative: 30 %
Lymphs Abs: 2.3 10*3/uL (ref 0.7–4.0)
MCH: 29.2 pg (ref 26.0–34.0)
MCHC: 32.2 g/dL (ref 30.0–36.0)
MCV: 90.8 fL (ref 80.0–100.0)
Monocytes Absolute: 0.7 10*3/uL (ref 0.1–1.0)
Monocytes Relative: 10 %
Neutro Abs: 4.2 10*3/uL (ref 1.7–7.7)
Neutrophils Relative %: 57 %
Platelets: 307 10*3/uL (ref 150–400)
RBC: 4.45 MIL/uL (ref 3.87–5.11)
RDW: 14.7 % (ref 11.5–15.5)
WBC: 7.5 10*3/uL (ref 4.0–10.5)
nRBC: 0 % (ref 0.0–0.2)

## 2020-09-11 LAB — HCG, SERUM, QUALITATIVE: Preg, Serum: NEGATIVE

## 2020-09-11 NOTE — Telephone Encounter (Signed)
Pt returned call and verbalized understanding. Pt has scheduled appt for next week

## 2020-09-11 NOTE — Telephone Encounter (Signed)
I reviewed over this.  Clonidine would be an off label use.  That means its not approved by the FDA for that usage.  Unfortunately that is not something I can do.  We can discuss the situation further with the patient on a follow-up visit to see if there are other options or potential referrals thank you

## 2020-09-11 NOTE — Telephone Encounter (Signed)
Left message for a return call to inform per drs recommendations.

## 2020-09-13 ENCOUNTER — Encounter (HOSPITAL_COMMUNITY): Payer: Self-pay | Admitting: Obstetrics & Gynecology

## 2020-09-13 ENCOUNTER — Ambulatory Visit (HOSPITAL_COMMUNITY)
Admission: RE | Admit: 2020-09-13 | Discharge: 2020-09-13 | Disposition: A | Discharge status: Home / Self Care | Payer: Medicaid Other | Source: Ambulatory Visit | Attending: Obstetrics & Gynecology | Admitting: Obstetrics & Gynecology

## 2020-09-13 ENCOUNTER — Ambulatory Visit (HOSPITAL_COMMUNITY): Payer: Medicaid Other | Admitting: Anesthesiology

## 2020-09-13 ENCOUNTER — Encounter (HOSPITAL_COMMUNITY)
Admission: RE | Disposition: A | Discharge status: Home / Self Care | Payer: Self-pay | Source: Ambulatory Visit | Attending: Obstetrics & Gynecology

## 2020-09-13 DIAGNOSIS — R87618 Other abnormal cytological findings on specimens from cervix uteri: Secondary | ICD-10-CM | POA: Insufficient documentation

## 2020-09-13 DIAGNOSIS — Z886 Allergy status to analgesic agent status: Secondary | ICD-10-CM | POA: Insufficient documentation

## 2020-09-13 DIAGNOSIS — N871 Moderate cervical dysplasia: Secondary | ICD-10-CM

## 2020-09-13 DIAGNOSIS — N879 Dysplasia of cervix uteri, unspecified: Secondary | ICD-10-CM | POA: Diagnosis not present

## 2020-09-13 DIAGNOSIS — D069 Carcinoma in situ of cervix, unspecified: Secondary | ICD-10-CM | POA: Diagnosis not present

## 2020-09-13 HISTORY — PX: CERVICAL ABLATION: SHX5771

## 2020-09-13 SURGERY — ABLATION, CERVIX
Anesthesia: General

## 2020-09-13 MED ORDER — FENTANYL CITRATE (PF) 250 MCG/5ML IJ SOLN
INTRAMUSCULAR | Status: AC
  Filled 2020-09-13: qty 5

## 2020-09-13 MED ORDER — ACETIC ACID 5 % SOLN
Status: DC | PRN
  Administered 2020-09-13: 1 via TOPICAL

## 2020-09-13 MED ORDER — SUGAMMADEX SODIUM 500 MG/5ML IV SOLN
INTRAVENOUS | Status: AC
  Filled 2020-09-13: qty 5

## 2020-09-13 MED ORDER — FENTANYL CITRATE PF 50 MCG/ML IJ SOSY: 25.0000 ug | PREFILLED_SYRINGE | INTRAMUSCULAR | Status: DC | PRN

## 2020-09-13 MED ORDER — FERRIC SUBSULFATE 259 MG/GM EX SOLN
CUTANEOUS | Status: DC | PRN
  Administered 2020-09-13: 1

## 2020-09-13 MED ORDER — PROPOFOL 10 MG/ML IV BOLUS
INTRAVENOUS | Status: DC | PRN
  Administered 2020-09-13: 200 mg via INTRAVENOUS

## 2020-09-13 MED ORDER — LACTATED RINGERS IV SOLN: INTRAVENOUS | Status: DC

## 2020-09-13 MED ORDER — ONDANSETRON HCL 4 MG/2ML IJ SOLN
INTRAMUSCULAR | Status: DC | PRN
  Administered 2020-09-13: 4 mg via INTRAVENOUS

## 2020-09-13 MED ORDER — CHLORHEXIDINE GLUCONATE 0.12 % MT SOLN
15.0000 mL | Freq: Once | OROMUCOSAL | Status: AC
  Administered 2020-09-13: 15 mL via OROMUCOSAL

## 2020-09-13 MED ORDER — MIDAZOLAM HCL 2 MG/2ML IJ SOLN
INTRAMUSCULAR | Status: AC
  Filled 2020-09-13: qty 2

## 2020-09-13 MED ORDER — PROPOFOL 10 MG/ML IV BOLUS
INTRAVENOUS | Status: AC
  Filled 2020-09-13: qty 20

## 2020-09-13 MED ORDER — ORAL CARE MOUTH RINSE: 15.0000 mL | Freq: Once | OROMUCOSAL | Status: AC

## 2020-09-13 MED ORDER — LIDOCAINE HCL (PF) 2 % IJ SOLN
INTRAMUSCULAR | Status: AC
  Filled 2020-09-13: qty 5

## 2020-09-13 MED ORDER — ONDANSETRON HCL 4 MG/2ML IJ SOLN: 4.0000 mg | Freq: Once | INTRAMUSCULAR | Status: DC | PRN

## 2020-09-13 MED ORDER — ONDANSETRON HCL 4 MG/2ML IJ SOLN
INTRAMUSCULAR | Status: AC
  Filled 2020-09-13: qty 2

## 2020-09-13 MED ORDER — MIDAZOLAM HCL 5 MG/5ML IJ SOLN
INTRAMUSCULAR | Status: DC | PRN
  Administered 2020-09-13: 2 mg via INTRAVENOUS

## 2020-09-13 MED ORDER — LACTATED RINGERS IV SOLN
INTRAVENOUS | Status: DC
  Administered 2020-09-13: 1000 mL via INTRAVENOUS

## 2020-09-13 MED ORDER — LIDOCAINE HCL (CARDIAC) PF 50 MG/5ML IV SOSY
PREFILLED_SYRINGE | INTRAVENOUS | Status: DC | PRN
  Administered 2020-09-13: 80 mg via INTRAVENOUS

## 2020-09-13 MED ORDER — WATER FOR IRRIGATION, STERILE IR SOLN
Status: DC | PRN
  Administered 2020-09-13: 1000 mL

## 2020-09-13 MED ORDER — FENTANYL CITRATE (PF) 100 MCG/2ML IJ SOLN
INTRAMUSCULAR | Status: DC | PRN
  Administered 2020-09-13 (×2): 50 ug via INTRAVENOUS

## 2020-09-13 SURGICAL SUPPLY — 25 items
APL FBRTP 16 NS LF PRCTSCP (MISCELLANEOUS) ×1
APL SWBSTK 6 STRL LF DISP (MISCELLANEOUS) ×1
APPLICATOR COTTON TIP 6 STRL (MISCELLANEOUS) ×1 IMPLANT
APPLICATOR COTTON TIP 6IN STRL (MISCELLANEOUS) ×2
BAG HAMPER (MISCELLANEOUS) ×2 IMPLANT
CLOTH BEACON ORANGE TIMEOUT ST (SAFETY) ×2 IMPLANT
COVER LIGHT HANDLE STERIS (MISCELLANEOUS) ×4 IMPLANT
COVER MAYO STAND XLG (MISCELLANEOUS) ×2 IMPLANT
GAUZE 4X4 16PLY ~~LOC~~+RFID DBL (SPONGE) ×2 IMPLANT
GLOVE ECLIPSE 8.0 STRL XLNG CF (GLOVE) ×2 IMPLANT
GLOVE SURG ENC MOIS LTX SZ6.5 (GLOVE) ×2 IMPLANT
GLOVE SURG UNDER POLY LF SZ7 (GLOVE) ×4 IMPLANT
GOWN STRL REUS W/TWL LRG LVL3 (GOWN DISPOSABLE) ×4 IMPLANT
KIT TURNOVER KIT A (KITS) ×2 IMPLANT
LASER FIBER 1000M SMARTSCOPE (Laser) ×2 IMPLANT
MANIFOLD NEPTUNE II (INSTRUMENTS) ×2 IMPLANT
MARKER SKIN DUAL TIP RULER LAB (MISCELLANEOUS) ×2 IMPLANT
PACK BASIC III (CUSTOM PROCEDURE TRAY) ×2
PACK SRG BSC III STRL LF ECLPS (CUSTOM PROCEDURE TRAY) ×1 IMPLANT
PAD ARMBOARD 7.5X6 YLW CONV (MISCELLANEOUS) ×2 IMPLANT
SET BASIN LINEN APH (SET/KITS/TRAYS/PACK) ×2 IMPLANT
SHEET LAVH (DRAPES) ×2 IMPLANT
SWAB PROCTOSCOPIC (MISCELLANEOUS) ×2 IMPLANT
TUBING SMOKE EVAC CO2 (TUBING) ×2 IMPLANT
WATER STERILE IRR 1000ML POUR (IV SOLUTION) ×2 IMPLANT

## 2020-09-13 NOTE — Interval H&P Note (Signed)
History and Physical Interval Note:  09/13/2020 8:37 AM  Jennifer Aguilar  has presented today for surgery, with the diagnosis of CIN 2/3.  The various methods of treatment have been discussed with the patient and family. After consideration of risks, benefits and other options for treatment, the patient has consented to  Procedure(s): LASER CERVICAL ABLATION (N/A) as a surgical intervention.  The patient's history has been reviewed, patient examined, no change in status, stable for surgery.  I have reviewed the patient's chart and labs.  Questions were answered to the patient's satisfaction.     Sharon Seller

## 2020-09-13 NOTE — Transfer of Care (Signed)
Immediate Anesthesia Transfer of Care Note  Patient: Jennifer Aguilar  Procedure(s) Performed: LASER CERVICAL ABLATION  Patient Location: PACU  Anesthesia Type:General  Level of Consciousness: awake  Airway & Oxygen Therapy: Patient Spontanous Breathing  Post-op Assessment: Report given to RN  Post vital signs: Reviewed and stable  Last Vitals:  Vitals Value Taken Time  BP    Temp    Pulse 91 09/13/20 0948  Resp 14 09/13/20 0948  SpO2 100 % 09/13/20 0948  Vitals shown include unvalidated device data.  Last Pain:  Vitals:   09/13/20 0746  TempSrc: Oral  PainSc: 0-No pain      Patients Stated Pain Goal: 8 (09/13/20 0746)  Complications: No notable events documented.

## 2020-09-13 NOTE — Anesthesia Postprocedure Evaluation (Signed)
Anesthesia Post Note  Patient: Jennifer Aguilar  Procedure(s) Performed: LASER CERVICAL ABLATION  Patient location during evaluation: Phase II Anesthesia Type: General Level of consciousness: awake Pain management: pain level controlled Vital Signs Assessment: post-procedure vital signs reviewed and stable Respiratory status: spontaneous breathing and respiratory function stable Cardiovascular status: blood pressure returned to baseline and stable Postop Assessment: no headache and no apparent nausea or vomiting Anesthetic complications: no Comments: Late entry   No notable events documented.   Last Vitals:  Vitals:   09/13/20 1000 09/13/20 1023  BP: (!) 116/97 124/84  Pulse: 70 69  Resp: 12 18  Temp:  (!) 36.4 C  SpO2: 100%     Last Pain:  Vitals:   09/13/20 1023  TempSrc: Oral  PainSc: 0-No pain                 Windell Norfolk

## 2020-09-13 NOTE — Anesthesia Postprocedure Evaluation (Signed)
Anesthesia Post Note  Patient: Jennifer D Hemberger  Procedure(s) Performed: LASER CERVICAL ABLATION  Patient location during evaluation: Phase II Anesthesia Type: General Level of consciousness: awake Pain management: pain level controlled Vital Signs Assessment: post-procedure vital signs reviewed and stable Respiratory status: spontaneous breathing and respiratory function stable Cardiovascular status: blood pressure returned to baseline and stable Postop Assessment: no headache and no apparent nausea or vomiting Anesthetic complications: no Comments: Late entry   No notable events documented.   Last Vitals:  Vitals:   09/13/20 1000 09/13/20 1023  BP: (!) 116/97 124/84  Pulse: 70 69  Resp: 12 18  Temp:  (!) 36.4 C  SpO2: 100%     Last Pain:  Vitals:   09/13/20 1023  TempSrc: Oral  PainSc: 0-No pain                 Tristan Proto J Ndeye Tenorio     

## 2020-09-13 NOTE — Anesthesia Preprocedure Evaluation (Signed)
Anesthesia Evaluation  Patient identified by MRN, date of birth, ID band Patient awake    Reviewed: Allergy & Precautions, H&P , NPO status , Patient's Chart, lab work & pertinent test results, reviewed documented beta blocker date and time   Airway Mallampati: II  TM Distance: >3 FB Neck ROM: full    Dental no notable dental hx.    Pulmonary neg pulmonary ROS,    Pulmonary exam normal breath sounds clear to auscultation       Cardiovascular Exercise Tolerance: Good hypertension, negative cardio ROS   Rhythm:regular Rate:Normal     Neuro/Psych negative neurological ROS  negative psych ROS   GI/Hepatic negative GI ROS, Neg liver ROS,   Endo/Other  negative endocrine ROS  Renal/GU negative Renal ROS  negative genitourinary   Musculoskeletal   Abdominal   Peds  Hematology negative hematology ROS (+)   Anesthesia Other Findings   Reproductive/Obstetrics negative OB ROS                             Anesthesia Physical Anesthesia Plan  ASA: 2  Anesthesia Plan: General   Post-op Pain Management:    Induction:   PONV Risk Score and Plan: Ondansetron  Airway Management Planned:   Additional Equipment:   Intra-op Plan:   Post-operative Plan:   Informed Consent: I have reviewed the patients History and Physical, chart, labs and discussed the procedure including the risks, benefits and alternatives for the proposed anesthesia with the patient or authorized representative who has indicated his/her understanding and acceptance.     Dental Advisory Given  Plan Discussed with: CRNA  Anesthesia Plan Comments:         Anesthesia Quick Evaluation

## 2020-09-13 NOTE — Anesthesia Procedure Notes (Signed)
Procedure Name: LMA Insertion Date/Time: 09/13/2020 9:01 AM Performed by: Moshe Salisbury, CRNA Pre-anesthesia Checklist: Patient identified, Patient being monitored, Emergency Drugs available, Timeout performed and Suction available Patient Re-evaluated:Patient Re-evaluated prior to induction Oxygen Delivery Method: Circle System Utilized Preoxygenation: Pre-oxygenation with 100% oxygen Induction Type: IV induction Ventilation: Mask ventilation without difficulty LMA: LMA inserted LMA Size: 4.0 Number of attempts: 1 Placement Confirmation: positive ETCO2 and breath sounds checked- equal and bilateral

## 2020-09-13 NOTE — Discharge Instructions (Addendum)
HOME INSTRUCTIONS  Please note any unusual or excessive bleeding, pain, swelling. Mild dizziness or drowsiness are normal for about 24 hours after surgery.   Shower when comfortable  Restrictions: No driving for 24 hours or while taking pain medications.  Activity:  No heavy lifting (> 10 lbs), nothing in vagina (no tampons, douching, or intercourse) x 4 weeks; no tub baths for 4 weeks Vaginal spotting is expected but if your bleeding is heavy, period like,  please call the office   Diet:  You may return to your regular diet.  Do not eat large meals.  Eat small frequent meals throughout the day.  Continue to drink a good amount of water at least 6-8 glasses of water per day, hydration is very important for the healing process.  Pain Management: Take over the counter ibuprofen or tylenol as needed.  Always take prescription pain medication with food, it may cause constipation, increase fluids and fiber and you may want to take an over-the-counter stool softener like Colace as needed up to 2x a day.    Alcohol -- Avoid for 24 hours and while taking pain medications.  Nausea: Take sips of ginger ale or soda  Fever -- Call physician if temperature over 101 degrees  Follow up:  If you do not already have a follow up appointment scheduled, please call the office at [phone].  If you experience fever (a temperature greater than 100.4), pain unrelieved by pain medication, shortness of breath, swelling of a single leg, or any other symptoms which are concerning to you please the office immediately.

## 2020-09-13 NOTE — Op Note (Addendum)
Preoperative Diagnosis:  CIN-2/3, adequate colposcopy  Postoperative Diagnosis:  Same as above  Procedure:  Laser ablation of the cervix  Surgeon: Dr. Myna Hidalgo and Dr. Turner Daniels  Anaesthesia:  Laryngeal Mask Airway  Findings:  Patient had an abnormal pap smear which was evaluated in the office with colposcopy and directed biopsies.  Pathology report returned as CIN2/3  The colposcopy was adequate.  As a result, the patient is admitted for laser ablation of the cervix.  Description of Note:  Patient was taken to the OR and placed in the supine position where she underwent laryngeal mask airway anaesthesia.  She was placed in the dorsal lithotomy position.  She was draped for laser.  Graves speculum was placed and 3% acetic acid used and the laser microscope employed to perform colposcopy which confirmed the office findings.  Laser was used on typical cervical settings and used to vaporized the squamocolumnar junction to  depth of  5-7 mm peripherally and 7-9 mm centrally.  Surgical margin of several mm was employed beyond the acetowhite epithelium.  Hemostasis was achieved with the laser and Monsel's solution.  Patient was awakened from anaesthesia in good stable condition and all counts were correct.    Myna Hidalgo, DO Attending Obstetrician & Gynecologist, [facility] for [employer], [facility]

## 2020-09-15 ENCOUNTER — Encounter (HOSPITAL_COMMUNITY): Payer: Self-pay | Admitting: Obstetrics & Gynecology

## 2020-09-20 ENCOUNTER — Encounter: Payer: Self-pay | Admitting: Family Medicine

## 2020-09-20 ENCOUNTER — Other Ambulatory Visit: Payer: Self-pay

## 2020-09-20 ENCOUNTER — Ambulatory Visit (INDEPENDENT_AMBULATORY_CARE_PROVIDER_SITE_OTHER): Payer: Medicaid Other | Admitting: Family Medicine

## 2020-09-20 VITALS — BP 114/74

## 2020-09-20 DIAGNOSIS — R232 Flushing: Secondary | ICD-10-CM | POA: Diagnosis not present

## 2020-09-20 DIAGNOSIS — F411 Generalized anxiety disorder: Secondary | ICD-10-CM

## 2020-09-20 DIAGNOSIS — R000 Tachycardia, unspecified: Secondary | ICD-10-CM | POA: Diagnosis not present

## 2020-09-20 MED ORDER — FLUOXETINE HCL 10 MG PO CAPS: 10.0000 mg | ORAL_CAPSULE | Freq: Every day | ORAL | 3 refills | Status: DC

## 2020-09-20 NOTE — Progress Notes (Signed)
   Subjective:    Patient ID: Jennifer Aguilar, female    DOB: 07/14/1995, 25 y.o.   MRN: [id number]  HPI  Patient arrives to discuss facial flushing. Patient states she has been treated for Rosacea since April but it has not helped the flushing. Patient states that time she feels stressed at other times she feels flushed in the face other times finds her self feeling anxious nervous worrying about lots of things that she cannot control.  States her face will get flushed and her ears will get red.  Also states at times her heart runs fast Review of Systems     Objective:   Physical Exam  General-in no acute distress Eyes-no discharge Lungs-respiratory rate normal, CTA CV-no murmurs,RRR Extremities skin warm dry no edema Neuro grossly normal Behavior normal, alert       Assessment & Plan:  We will check labs to rule out other issues Initiate low-dose Prozac Side effects discussed Patient not suicidal Do a phone visit follow-up in 2 to 3 weeks

## 2020-09-25 ENCOUNTER — Ambulatory Visit: Payer: Medicaid Other | Admitting: Obstetrics & Gynecology

## 2020-09-26 LAB — T4, FREE: Free T4: 1.23 ng/dL (ref 0.82–1.77)

## 2020-09-26 LAB — TSH: TSH: 0.86 u[IU]/mL (ref 0.450–4.500)

## 2020-09-26 LAB — METANEPHRINES, PLASMA
Metanephrine, Free: <10 pg/mL (ref 0.0–88.0)
Normetanephrine, Free: 48.7 pg/mL (ref 0.0–210.1)

## 2020-10-11 ENCOUNTER — Other Ambulatory Visit: Payer: Self-pay

## 2020-10-11 ENCOUNTER — Ambulatory Visit (INDEPENDENT_AMBULATORY_CARE_PROVIDER_SITE_OTHER): Payer: Medicaid Other | Admitting: Family Medicine

## 2020-10-11 DIAGNOSIS — F411 Generalized anxiety disorder: Secondary | ICD-10-CM

## 2020-10-11 NOTE — Progress Notes (Signed)
I connected with  Jennifer Aguilar on 10/11/20 by a phone enabled telemedicine application and verified that I am speaking with the correct person using two identifiers.   I discussed the limitations of evaluation and management by telemedicine. The patient expressed understanding and agreed to proceed.  Patient location: home  Provider location: in office  I provided 12 minutes of non face - to - face time during this encounter.   Subjective:    Patient ID: Jennifer Aguilar, female    DOB: 1995/11/03, 25 y.o.   MRN: [id number]  HPI GAD follow up currently on fluoxetine 10 mg - has no concerns at this time Taking her medicine she states she does get some benefit from it she denies any major setbacks She states less anxiousness/worry and not feeling depressed or blue Review of Systems Patient was having palpitation issues feeling nervous anxious she states she is having much less of this now    Objective:   Physical Exam   Today's visit was via telephone Physical exam was not possible for this visit       Assessment & Plan:  Patient overall seems to be doing well with the medicine she states she is having less less anxiousness less worry and feels that been fitting from the medicine she would like to continue it.  If she does not see substantial improvement over the next 4 weeks she is to let us know and we will help bump up the dose of her medicine  Otherwise we will do a follow-up in person in mid December

## 2020-10-19 NOTE — Progress Notes (Signed)
I have left vm for patient

## 2020-10-30 ENCOUNTER — Ambulatory Visit: Payer: Medicaid Other | Admitting: Obstetrics & Gynecology

## 2020-11-01 ENCOUNTER — Other Ambulatory Visit: Payer: Self-pay | Admitting: Family Medicine

## 2020-11-02 DIAGNOSIS — L719 Rosacea, unspecified: Secondary | ICD-10-CM | POA: Diagnosis not present

## 2020-11-11 DIAGNOSIS — R21 Rash and other nonspecific skin eruption: Secondary | ICD-10-CM | POA: Diagnosis not present

## 2020-11-13 ENCOUNTER — Other Ambulatory Visit: Payer: Self-pay | Admitting: Family Medicine

## 2020-11-13 ENCOUNTER — Telehealth: Payer: Self-pay

## 2020-11-13 NOTE — Telephone Encounter (Signed)
Transition Care Management Unsuccessful Follow-up Telephone Call  Date of discharge and from where:  11/11/2020-[facility]  Attempts:  1st Attempt  Reason for unsuccessful TCM follow-up call:  Left voice message

## 2020-11-14 NOTE — Telephone Encounter (Signed)
Transition Care Management Unsuccessful Follow-up Telephone Call  Date of discharge and from where:  11/11/2020 from [facility]  Attempts:  2nd Attempt  Reason for unsuccessful TCM follow-up call:  Left voice message

## 2020-11-15 NOTE — Telephone Encounter (Signed)
Transition Care Management Unsuccessful Follow-up Telephone Call  Date of discharge and from where:  11/11/2020 from [facility]  Attempts:  3rd Attempt  Reason for unsuccessful TCM follow-up call:  Unable to reach patient

## 2020-11-21 ENCOUNTER — Other Ambulatory Visit: Payer: Self-pay | Admitting: Family Medicine

## 2020-11-23 ENCOUNTER — Ambulatory Visit: Payer: Medicaid Other | Admitting: Obstetrics & Gynecology

## 2020-12-25 ENCOUNTER — Ambulatory Visit: Payer: Medicaid Other | Admitting: Obstetrics & Gynecology

## 2021-01-03 ENCOUNTER — Ambulatory Visit: Payer: Medicaid Other | Admitting: Obstetrics & Gynecology

## 2021-01-23 ENCOUNTER — Ambulatory Visit: Payer: Medicaid Other | Admitting: Obstetrics & Gynecology

## 2021-01-23 ENCOUNTER — Encounter: Payer: Self-pay | Admitting: Obstetrics & Gynecology

## 2021-01-23 ENCOUNTER — Other Ambulatory Visit (HOSPITAL_COMMUNITY)
Admission: RE | Admit: 2021-01-23 | Discharge: 2021-01-23 | Disposition: A | Discharge status: Home / Self Care | Payer: Medicaid Other | Source: Ambulatory Visit | Attending: Obstetrics & Gynecology | Admitting: Obstetrics & Gynecology

## 2021-01-23 ENCOUNTER — Other Ambulatory Visit: Payer: Self-pay

## 2021-01-23 VITALS — BP 133/85 | HR 98 | Ht 65.0 in | Wt 168.0 lb

## 2021-01-23 DIAGNOSIS — Z87410 Personal history of cervical dysplasia: Secondary | ICD-10-CM | POA: Diagnosis not present

## 2021-01-23 DIAGNOSIS — Z124 Encounter for screening for malignant neoplasm of cervix: Secondary | ICD-10-CM | POA: Insufficient documentation

## 2021-01-23 NOTE — Progress Notes (Signed)
Follow up appointment for surveillance: cytology only S/P laser ablation of the cervix 8/22  Chief Complaint  Patient presents with   Follow-up    Had LEEP in August never followed up    Blood pressure 133/85, pulse 98, height 5\' 5"  (1.651 m), weight 168 lb (76.2 kg), last menstrual period 01/05/2021, not currently breastfeeding.  No complaints  Doing well   General WDWN female NAD Vulva:  normal appearing vulva with no masses, tenderness or lesions Vagina:  normal mucosa, no discharge Cervix:  Normal no lesions Pap cytology performed Uterus:  normal size, contour, position, consistency, mobility, non-tender Adnexa: ovaries:present,  normal adnexa in size, nontender and no masses   MEDS ordered this encounter: No orders of the defined types were placed in this encounter.   Orders for this encounter: No orders of the defined types were placed in this encounter.   Impression:   ICD-10-CM   1. History of cervical dysplasia, severe, S/P laser ablation of the cervix 8/22  Z87.410     2. Routine cervical smear  Z12.4 Cytology - PAP( [place])       Plan: Follow up Pap cytology 6 months  Follow Up: Return in about 6 months (around 07/23/2021) for Follow up pap.      All questions were answered.  Past Medical History:  Diagnosis Date   Gestational hypertension    Menometrorrhagia 08/29/2014   Seasonal allergies     Past Surgical History:  Procedure Laterality Date   CERVICAL ABLATION N/A 09/13/2020   Procedure: LASER CERVICAL ABLATION;  Surgeon: Janyth Pupa, DO;  Location: [facility];  Service: Gynecology;  Laterality: N/A;   IUD REMOVAL     LAPAROSCOPIC BILATERAL SALPINGECTOMY Bilateral 07/21/2019   Procedure: LAPAROSCOPIC BILATERAL SALPINGECTOMY FOR STERILIZATION;  Surgeon: Florian Buff, MD;  Location: [facility];  Service: Gynecology;  Laterality: Bilateral;   LAPAROSCOPY N/A 09/01/2018   Procedure: LAPAROSCOPY DIAGNOSTIC, REMOVAL OF INTRAPERITONEAL  INTRAUTERINE DEVICE;  Surgeon: Jonnie Kind, MD;  Location: [facility];  Service: Gynecology;  Laterality: N/A;   NO PAST SURGERIES      OB History     Gravida  4   Para  4   Term  4   Preterm      AB      Living  4      SAB      IAB      Ectopic      Multiple  0   Live Births  4           Allergies  Allergen Reactions   Meloxicam     REACTION: increased heart rate, body numbness; tolerates ibuprofen, per pt    Social History   Socioeconomic History   Marital status: Single    Spouse name: Not on file   Number of children: 3   Years of education: Not on file   Highest education level: Not on file  Occupational History   Not on file  Tobacco Use   Smoking status: Never   Smokeless tobacco: Never  Vaping Use   Vaping Use: Never used  Substance and Sexual Activity   Alcohol use: No   Drug use: No   Sexual activity: Yes    Birth control/protection: Surgical  Other Topics Concern   Not on file  Social History Narrative   Not on file   Social Determinants of Health   Financial Resource Strain: Not on file  Food Insecurity: Not on file  Transportation  Needs: Not on file  Physical Activity: Not on file  Stress: Not on file  Social Connections: Not on file    Family History  Problem Relation Age of Onset   Diabetes Mother    Hypertension Mother    Hypertension Father    Heart failure Maternal Grandfather    Hypertension Sister    Hypertension Paternal Grandfather    Heart failure Maternal Grandmother    Diabetes Maternal Grandmother    Cancer Paternal Aunt    Hypertension Paternal Uncle

## 2021-01-24 ENCOUNTER — Ambulatory Visit (INDEPENDENT_AMBULATORY_CARE_PROVIDER_SITE_OTHER): Payer: Medicaid Other | Admitting: Family Medicine

## 2021-01-24 ENCOUNTER — Other Ambulatory Visit: Payer: Self-pay | Admitting: Family Medicine

## 2021-01-24 DIAGNOSIS — Q939 Deletion from autosomes, unspecified: Secondary | ICD-10-CM | POA: Diagnosis not present

## 2021-01-24 DIAGNOSIS — R000 Tachycardia, unspecified: Secondary | ICD-10-CM

## 2021-01-24 NOTE — Progress Notes (Signed)
° °  Subjective:    Patient ID: Jennifer Aguilar, female    DOB: 08-Mar-1995, 26 y.o.   MRN: [id number]  HPI Has underlying genetic mutation Her kids also have The geneticist recommends that she have an echo to make sure she does not have an underlying structural problem with her heart associated with a genetic deletion She denies any chest pain shortness of breath  Review of Systems     Objective:   Physical Exam  Lungs clear heart regular no murmurs blood pressure good      Assessment & Plan:  1. 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

## 2021-01-29 LAB — CYTOLOGY - PAP
Comment: NEGATIVE
Diagnosis: UNDETERMINED — AB
High risk HPV: NEGATIVE

## 2021-04-12 ENCOUNTER — Ambulatory Visit (HOSPITAL_COMMUNITY)
Admission: RE | Admit: 2021-04-12 | Discharge: 2021-04-12 | Disposition: A | Discharge status: Home / Self Care | Payer: Medicaid Other | Source: Ambulatory Visit | Attending: Family Medicine | Admitting: Family Medicine

## 2021-04-12 DIAGNOSIS — R000 Tachycardia, unspecified: Secondary | ICD-10-CM | POA: Diagnosis not present

## 2021-04-12 LAB — ECHOCARDIOGRAM COMPLETE
AR max vel: 2.31 cm2
AV Area VTI: 2.24 cm2
AV Area mean vel: 2.12 cm2
AV Mean grad: 3.9 mmHg
AV Peak grad: 7 mmHg
Ao pk vel: 1.32 m/s
Area-P 1/2: 5.38 cm2
S' Lateral: 2.8 cm

## 2021-04-12 NOTE — Progress Notes (Signed)
*  PRELIMINARY RESULTS* ?Echocardiogram ?2D Echocardiogram has been performed. ? ?Stacey Drain ?04/12/2021, 10:05 AM ?

## 2021-06-07 ENCOUNTER — Encounter: Payer: Self-pay | Admitting: Advanced Practice Midwife

## 2021-06-07 ENCOUNTER — Ambulatory Visit: Payer: Medicaid Other | Admitting: Advanced Practice Midwife

## 2021-06-07 VITALS — BP 137/88 | HR 100 | Ht 65.0 in | Wt 180.0 lb

## 2021-06-07 DIAGNOSIS — N9089 Other specified noninflammatory disorders of vulva and perineum: Secondary | ICD-10-CM | POA: Diagnosis not present

## 2021-06-07 DIAGNOSIS — B081 Molluscum contagiosum: Secondary | ICD-10-CM

## 2021-06-07 NOTE — Progress Notes (Signed)
[facility] Visit  Patient name: Jennifer Aguilar MRN [id number]  Date of birth: [birth year]  CC & HPI:  Jennifer Aguilar is a 26 y.o. Caucasian female presenting today for bumps on vulva/thighs.  Started out as 2 bumps about 4 days ago, now many.  Not painful or itchy. On doxycycline daily for roseacea  Pertinent History Reviewed:  Medical & Surgical Hx:   Past Medical History:  Diagnosis Date   Gestational hypertension    Menometrorrhagia 08/29/2014   Seasonal allergies    Past Surgical History:  Procedure Laterality Date   CERVICAL ABLATION N/A 09/13/2020   Procedure: LASER CERVICAL ABLATION;  Surgeon: Myna Hidalgo, DO;  Location: [facility];  Service: Gynecology;  Laterality: N/A;   IUD REMOVAL     LAPAROSCOPIC BILATERAL SALPINGECTOMY Bilateral 07/21/2019   Procedure: LAPAROSCOPIC BILATERAL SALPINGECTOMY FOR STERILIZATION;  Surgeon: Lazaro Arms, MD;  Location: [facility];  Service: Gynecology;  Laterality: Bilateral;   LAPAROSCOPY N/A 09/01/2018   Procedure: LAPAROSCOPY DIAGNOSTIC, REMOVAL OF INTRAPERITONEAL INTRAUTERINE DEVICE;  Surgeon: Tilda Burrow, MD;  Location: [facility];  Service: Gynecology;  Laterality: N/A;   NO PAST SURGERIES     Family History  Problem Relation Age of Onset   Diabetes Mother    Hypertension Mother    Hypertension Father    Heart failure Maternal Grandfather    Hypertension Sister    Hypertension Paternal Grandfather    Heart failure Maternal Grandmother    Diabetes Maternal Grandmother    Cancer Paternal Aunt    Hypertension Paternal Uncle     Current Outpatient Medications:    triamcinolone cream (KENALOG) 0.1 %, APPLY  CREAM EXTERNALLY TWICE DAILY AS NEEDED FOR ECZEMA, Disp: 160 g, Rfl: 1 Social History: Reviewed -  reports that she has never smoked. She has never used smokeless tobacco.  Review of Systems:   Constitutional: Negative for fever and chills Eyes: Negative for visual disturbances Respiratory: Negative for shortness of  breath, dyspnea Cardiovascular: Negative for chest pain or palpitations  Gastrointestinal: Negative for vomiting, diarrhea and constipation; no abdominal pain Genitourinary: Negative for dysuria and urgency, vaginal irritation or itching Musculoskeletal: Negative for back pain, joint pain, myalgias  Neurological: Negative for dizziness and headaches    Objective Findings:    Physical Examination: Vitals:   06/07/21 1517  BP: 137/88  Pulse: 100   General appearance - well appearing, and in no distress Mental status - alert, oriented to person, place, and time Chest:  Normal respiratory effort Heart - normal rate and regular rhythm Abdomen:  Soft, nontender Pelvic: co exam w/Dr. Charlotta Newton.  Appears to be molluscum, about 50 or so bumps. HSV culture taken to be thorough.  Musculoskeletal:  Normal range of motion without pain Extremities:  No edema    No results found for this or any previous visit (from the past 24 hour(s)).    Assessment & Plan:  A:   Molluscum contagiosum P:  Supportive measures given.    No follow-ups on file.  Jacklyn Shell CNM 06/07/2021 3:35 PM

## 2021-06-07 NOTE — Patient Instructions (Signed)
What is Molluscum Contagiosum? You will find out how these bumps are transmitted and treated. To view the content, go to this web address: [email or web address]  This video will expire on: 04/03/2023. If you need access to this video following this date, please reach out to the healthcare provider who assigned it to you. This information is not intended to replace advice given to you by your health care provider. Make sure you discuss any questions you have with your health care provider. Elsevier Patient Education  2023 [employer].

## 2021-06-09 DIAGNOSIS — R21 Rash and other nonspecific skin eruption: Secondary | ICD-10-CM | POA: Diagnosis not present

## 2021-06-10 LAB — HERPES SIMPLEX VIRUS CULTURE

## 2021-06-11 ENCOUNTER — Encounter: Payer: Self-pay | Admitting: Advanced Practice Midwife

## 2021-06-11 ENCOUNTER — Other Ambulatory Visit: Payer: Self-pay | Admitting: Advanced Practice Midwife

## 2021-06-11 DIAGNOSIS — B009 Herpesviral infection, unspecified: Secondary | ICD-10-CM

## 2021-06-11 HISTORY — DX: Herpesviral infection, unspecified: B00.9

## 2021-06-11 MED ORDER — VALACYCLOVIR HCL 1 G PO TABS: 1000.0000 mg | ORAL_TABLET | Freq: Two times a day (BID) | ORAL | 0 refills | Status: DC

## 2021-06-12 ENCOUNTER — Telehealth: Payer: Self-pay

## 2021-06-12 NOTE — Telephone Encounter (Signed)
Transition Care Management Unsuccessful Follow-up Telephone Call  Date of discharge and from where:  06/09/2021 from [facility]  Attempts:  1st Attempt  Reason for unsuccessful TCM follow-up call:  Left voice message

## 2021-06-13 NOTE — Telephone Encounter (Signed)
Transition Care Management Unsuccessful Follow-up Telephone Call  Date of discharge and from where:  06/09/2021 from [facility]  Attempts:  2nd Attempt  Reason for unsuccessful TCM follow-up call:  Left voice message

## 2021-06-14 NOTE — Telephone Encounter (Signed)
Transition Care Management Unsuccessful Follow-up Telephone Call  Date of discharge and from where:  06/09/2021-[facility]  Attempts:  3rd Attempt  Reason for unsuccessful TCM follow-up call:  Left voice message

## 2021-07-10 ENCOUNTER — Encounter: Payer: Self-pay | Admitting: Family Medicine

## 2021-07-12 ENCOUNTER — Other Ambulatory Visit: Payer: Self-pay | Admitting: Advanced Practice Midwife

## 2021-07-12 ENCOUNTER — Encounter: Payer: Self-pay | Admitting: Family Medicine

## 2021-07-12 MED ORDER — VALACYCLOVIR HCL 1 G PO TABS: 1000.0000 mg | ORAL_TABLET | Freq: Two times a day (BID) | ORAL | 0 refills | Status: AC

## 2022-01-21 DIAGNOSIS — L719 Rosacea, unspecified: Secondary | ICD-10-CM | POA: Diagnosis not present

## 2022-04-23 ENCOUNTER — Encounter: Payer: Self-pay | Admitting: Family Medicine

## 2022-04-24 ENCOUNTER — Other Ambulatory Visit: Payer: Self-pay | Admitting: Family Medicine

## 2022-04-24 MED ORDER — TRIAMCINOLONE ACETONIDE 0.1 % EX CREA: TOPICAL_CREAM | CUTANEOUS | 1 refills | Status: DC

## 2022-07-25 ENCOUNTER — Encounter: Payer: Self-pay | Admitting: Family Medicine

## 2022-08-01 ENCOUNTER — Telehealth: Payer: Self-pay

## 2022-08-01 NOTE — Telephone Encounter (Signed)
Nurse Note:  Physical Form ready front desk will contact mom.

## 2022-08-05 ENCOUNTER — Encounter: Payer: Self-pay | Admitting: Family Medicine

## 2022-08-15 NOTE — Telephone Encounter (Signed)
I am unable to print this out please have somebody print out the physical form that is embedded within her message then we can fill this out thank you

## 2022-08-16 NOTE — Telephone Encounter (Signed)
Nurses-I filled out the form that was printed off from her first communication  Please provide a copy of the shot records with this form to give to [facility]  Also she needs to make sure that she schedules her son for his 3-year checkup thanks-Dr. Lorin Picket

## 2022-09-02 ENCOUNTER — Encounter: Payer: Self-pay | Admitting: Family Medicine

## 2022-10-31 ENCOUNTER — Encounter: Payer: Self-pay | Admitting: Family Medicine

## 2023-06-27 ENCOUNTER — Other Ambulatory Visit: Payer: Self-pay | Admitting: Family Medicine

## 2023-06-30 ENCOUNTER — Other Ambulatory Visit: Payer: Self-pay

## 2023-06-30 MED ORDER — TRIAMCINOLONE ACETONIDE 0.1 % EX CREA: TOPICAL_CREAM | CUTANEOUS | 1 refills | Status: AC

## 2023-07-16 ENCOUNTER — Encounter: Payer: Self-pay | Admitting: Family Medicine

## 2023-09-10 ENCOUNTER — Encounter: Payer: Self-pay | Admitting: Family Medicine

## 2023-10-25 ENCOUNTER — Encounter: Payer: Self-pay | Admitting: Family Medicine
